# Patient Record
Sex: Male | Born: 1960 | Race: White | Hispanic: No | Smoking: Current every day smoker
Health system: Southern US, Community
[De-identification: ages and names within clinical notes are randomized; demographics above are authoritative.]

## PROBLEM LIST (undated history)

## (undated) DIAGNOSIS — F319 Bipolar disorder, unspecified: Secondary | ICD-10-CM

## (undated) DIAGNOSIS — Z8619 Personal history of other infectious and parasitic diseases: Secondary | ICD-10-CM

## (undated) DIAGNOSIS — I10 Essential (primary) hypertension: Secondary | ICD-10-CM

## (undated) DIAGNOSIS — R569 Unspecified convulsions: Secondary | ICD-10-CM

## (undated) DIAGNOSIS — F1010 Alcohol abuse, uncomplicated: Secondary | ICD-10-CM

## (undated) HISTORY — DX: Unspecified convulsions: R56.9

## (undated) HISTORY — DX: Essential (primary) hypertension: I10

## (undated) HISTORY — PX: REPLACEMENT TOTAL HIP W/  RESURFACING IMPLANTS: SUR1222

## (undated) HISTORY — DX: Personal history of other infectious and parasitic diseases: Z86.19

---

## 2002-10-05 DIAGNOSIS — R569 Unspecified convulsions: Secondary | ICD-10-CM

## 2002-10-05 HISTORY — DX: Unspecified convulsions: R56.9

## 2004-05-16 ENCOUNTER — Other Ambulatory Visit: Payer: Self-pay

## 2008-07-03 LAB — HM COLONOSCOPY

## 2010-10-05 HISTORY — PX: JOINT REPLACEMENT: SHX530

## 2011-04-14 ENCOUNTER — Emergency Department: Payer: Self-pay | Admitting: Emergency Medicine

## 2011-06-25 ENCOUNTER — Inpatient Hospital Stay: Payer: Self-pay | Admitting: Psychiatry

## 2012-06-18 ENCOUNTER — Inpatient Hospital Stay: Payer: Self-pay | Admitting: Internal Medicine

## 2012-06-18 LAB — COMPREHENSIVE METABOLIC PANEL
Albumin: 3.7 g/dL (ref 3.4–5.0)
Anion Gap: 21 — ABNORMAL HIGH (ref 7–16)
BUN: 2 mg/dL — ABNORMAL LOW (ref 7–18)
Calcium, Total: 8 mg/dL — ABNORMAL LOW (ref 8.5–10.1)
Chloride: 78 mmol/L — ABNORMAL LOW (ref 98–107)
Glucose: 52 mg/dL — ABNORMAL LOW (ref 65–99)
Osmolality: 225 (ref 275–301)
Potassium: 4.2 mmol/L (ref 3.5–5.1)
SGOT(AST): 388 U/L — ABNORMAL HIGH (ref 15–37)
Sodium: 114 mmol/L — CL (ref 136–145)
Total Protein: 6.8 g/dL (ref 6.4–8.2)

## 2012-06-18 LAB — CBC
MCV: 101 fL — ABNORMAL HIGH (ref 80–100)
Platelet: 152 10*3/uL (ref 150–440)
RDW: 13.4 % (ref 11.5–14.5)
WBC: 5.6 10*3/uL (ref 3.8–10.6)

## 2012-06-18 LAB — URINALYSIS, COMPLETE
Bilirubin,UR: NEGATIVE
Glucose,UR: 50 mg/dL (ref 0–75)
Nitrite: NEGATIVE
Ph: 6 (ref 4.5–8.0)
Specific Gravity: 1.002 (ref 1.003–1.030)
Squamous Epithelial: NONE SEEN
WBC UR: 1 /HPF (ref 0–5)

## 2012-06-18 LAB — DRUG SCREEN, URINE
Amphetamines, Ur Screen: NEGATIVE (ref ?–1000)
Benzodiazepine, Ur Scrn: NEGATIVE (ref ?–200)
Cannabinoid 50 Ng, Ur ~~LOC~~: NEGATIVE (ref ?–50)
Cocaine Metabolite,Ur ~~LOC~~: NEGATIVE (ref ?–300)
Methadone, Ur Screen: NEGATIVE (ref ?–300)
Opiate, Ur Screen: NEGATIVE (ref ?–300)
Phencyclidine (PCP) Ur S: NEGATIVE (ref ?–25)
Tricyclic, Ur Screen: NEGATIVE (ref ?–1000)

## 2012-06-18 LAB — SODIUM
Sodium: 121 mmol/L — ABNORMAL LOW (ref 136–145)
Sodium: 121 mmol/L — ABNORMAL LOW (ref 136–145)

## 2012-06-19 LAB — CBC WITH DIFFERENTIAL/PLATELET
Basophil %: 0.3 %
Eosinophil %: 1.6 %
HGB: 13 g/dL (ref 13.0–18.0)
Lymphocyte #: 0.5 10*3/uL — ABNORMAL LOW (ref 1.0–3.6)
MCH: 36 pg — ABNORMAL HIGH (ref 26.0–34.0)
MCHC: 36.2 g/dL — ABNORMAL HIGH (ref 32.0–36.0)
MCV: 100 fL (ref 80–100)
Monocyte #: 0.3 x10 3/mm (ref 0.2–1.0)
Neutrophil #: 2.6 10*3/uL (ref 1.4–6.5)
Neutrophil %: 75.7 %
Platelet: 114 10*3/uL — ABNORMAL LOW (ref 150–440)
RBC: 3.62 10*6/uL — ABNORMAL LOW (ref 4.40–5.90)

## 2012-06-19 LAB — COMPREHENSIVE METABOLIC PANEL
Albumin: 3.5 g/dL (ref 3.4–5.0)
Alkaline Phosphatase: 113 U/L (ref 50–136)
Anion Gap: 9 (ref 7–16)
Calcium, Total: 8.6 mg/dL (ref 8.5–10.1)
Chloride: 91 mmol/L — ABNORMAL LOW (ref 98–107)
Co2: 22 mmol/L (ref 21–32)
EGFR (African American): >60
EGFR (Non-African Amer.): >60
Glucose: 91 mg/dL (ref 65–99)
Osmolality: 243 (ref 275–301)
Potassium: 3.7 mmol/L (ref 3.5–5.1)
SGOT(AST): 264 U/L — ABNORMAL HIGH (ref 15–37)
Sodium: 122 mmol/L — ABNORMAL LOW (ref 136–145)

## 2012-06-19 LAB — LIPASE, BLOOD: Lipase: 426 U/L — ABNORMAL HIGH (ref 73–393)

## 2012-06-20 LAB — COMPREHENSIVE METABOLIC PANEL
Albumin: 3.2 g/dL — ABNORMAL LOW (ref 3.4–5.0)
BUN: 2 mg/dL — ABNORMAL LOW (ref 7–18)
Bilirubin,Total: 0.9 mg/dL (ref 0.2–1.0)
Chloride: 97 mmol/L — ABNORMAL LOW (ref 98–107)
Co2: 23 mmol/L (ref 21–32)
Creatinine: 0.81 mg/dL (ref 0.60–1.30)
EGFR (African American): >60
EGFR (Non-African Amer.): >60
Glucose: 117 mg/dL — ABNORMAL HIGH (ref 65–99)
Osmolality: 258 (ref 275–301)
SGOT(AST): 216 U/L — ABNORMAL HIGH (ref 15–37)
SGPT (ALT): 158 U/L — ABNORMAL HIGH (ref 12–78)
Sodium: 130 mmol/L — ABNORMAL LOW (ref 136–145)
Total Protein: 6 g/dL — ABNORMAL LOW (ref 6.4–8.2)

## 2012-06-21 LAB — COMPREHENSIVE METABOLIC PANEL
Albumin: 3.6 g/dL (ref 3.4–5.0)
Anion Gap: 10 (ref 7–16)
BUN: 2 mg/dL — ABNORMAL LOW (ref 7–18)
Co2: 23 mmol/L (ref 21–32)
Osmolality: 256 (ref 275–301)
Potassium: 3.9 mmol/L (ref 3.5–5.1)
Sodium: 130 mmol/L — ABNORMAL LOW (ref 136–145)
Total Protein: 6.6 g/dL (ref 6.4–8.2)

## 2012-06-23 LAB — BASIC METABOLIC PANEL
BUN: 5 mg/dL — ABNORMAL LOW (ref 7–18)
Chloride: 94 mmol/L — ABNORMAL LOW (ref 98–107)
Co2: 29 mmol/L (ref 21–32)
Creatinine: 0.67 mg/dL (ref 0.60–1.30)
EGFR (Non-African Amer.): >60
Osmolality: 261 (ref 275–301)
Potassium: 3.8 mmol/L (ref 3.5–5.1)
Sodium: 132 mmol/L — ABNORMAL LOW (ref 136–145)

## 2012-06-23 LAB — CBC WITH DIFFERENTIAL/PLATELET
Basophil %: 1 %
Eosinophil %: 5.9 %
HCT: 36 % — ABNORMAL LOW (ref 40.0–52.0)
HGB: 12.9 g/dL — ABNORMAL LOW (ref 13.0–18.0)
Lymphocyte #: 1.9 10*3/uL (ref 1.0–3.6)
MCH: 36.5 pg — ABNORMAL HIGH (ref 26.0–34.0)
MCV: 102 fL — ABNORMAL HIGH (ref 80–100)
Monocyte #: 1 x10 3/mm (ref 0.2–1.0)
Neutrophil #: 1.8 10*3/uL (ref 1.4–6.5)
Platelet: 177 10*3/uL (ref 150–440)
RBC: 3.53 10*6/uL — ABNORMAL LOW (ref 4.40–5.90)
WBC: 5.1 10*3/uL (ref 3.8–10.6)

## 2012-06-23 LAB — LIPASE, BLOOD: Lipase: 516 U/L — ABNORMAL HIGH (ref 73–393)

## 2012-06-25 LAB — BASIC METABOLIC PANEL
BUN: 9 mg/dL (ref 7–18)
Chloride: 100 mmol/L (ref 98–107)
Co2: 28 mmol/L (ref 21–32)
Creatinine: 0.77 mg/dL (ref 0.60–1.30)
EGFR (African American): >60
EGFR (Non-African Amer.): >60
Glucose: 90 mg/dL (ref 65–99)
Potassium: 4.2 mmol/L (ref 3.5–5.1)
Sodium: 136 mmol/L (ref 136–145)

## 2013-04-11 ENCOUNTER — Ambulatory Visit: Payer: Self-pay | Admitting: Adult Health

## 2013-04-12 ENCOUNTER — Ambulatory Visit: Payer: Self-pay | Admitting: Adult Health

## 2013-04-19 ENCOUNTER — Ambulatory Visit: Payer: Self-pay | Admitting: Adult Health

## 2013-04-27 ENCOUNTER — Encounter: Payer: Self-pay | Admitting: Adult Health

## 2013-04-27 ENCOUNTER — Ambulatory Visit (INDEPENDENT_AMBULATORY_CARE_PROVIDER_SITE_OTHER): Payer: No Typology Code available for payment source | Admitting: Adult Health

## 2013-04-27 VITALS — BP 106/68 | HR 70 | Temp 98.0°F | Resp 12 | Ht 65.75 in | Wt 131.0 lb

## 2013-04-27 DIAGNOSIS — F17200 Nicotine dependence, unspecified, uncomplicated: Secondary | ICD-10-CM

## 2013-04-27 DIAGNOSIS — Z716 Tobacco abuse counseling: Secondary | ICD-10-CM | POA: Insufficient documentation

## 2013-04-27 DIAGNOSIS — Z23 Encounter for immunization: Secondary | ICD-10-CM

## 2013-04-27 DIAGNOSIS — Z7189 Other specified counseling: Secondary | ICD-10-CM

## 2013-04-27 DIAGNOSIS — I10 Essential (primary) hypertension: Secondary | ICD-10-CM

## 2013-04-27 DIAGNOSIS — Z0000 Encounter for general adult medical examination without abnormal findings: Secondary | ICD-10-CM | POA: Insufficient documentation

## 2013-04-27 LAB — CBC WITH DIFFERENTIAL/PLATELET
Basophils Absolute: 0.1 10*3/uL (ref 0.0–0.1)
Eosinophils Absolute: 0.5 10*3/uL (ref 0.0–0.7)
Hemoglobin: 15.3 g/dL (ref 13.0–17.0)
Lymphocytes Relative: 42.8 % (ref 12.0–46.0)
MCHC: 35.2 g/dL (ref 30.0–36.0)
Monocytes Relative: 10.2 % (ref 3.0–12.0)
Neutro Abs: 1.7 10*3/uL (ref 1.4–7.7)
Neutrophils Relative %: 35.1 % — ABNORMAL LOW (ref 43.0–77.0)
RBC: 4.17 Mil/uL — ABNORMAL LOW (ref 4.22–5.81)
RDW: 12.9 % (ref 11.5–14.6)

## 2013-04-27 LAB — BASIC METABOLIC PANEL
BUN: 6 mg/dL (ref 6–23)
CO2: 25 mEq/L (ref 19–32)
Calcium: 9.1 mg/dL (ref 8.4–10.5)
Chloride: 98 mEq/L (ref 96–112)
Creatinine, Ser: 0.6 mg/dL (ref 0.4–1.5)
Glucose, Bld: 80 mg/dL (ref 70–99)

## 2013-04-27 LAB — HEPATIC FUNCTION PANEL
ALT: 15 U/L (ref 0–53)
Albumin: 3.9 g/dL (ref 3.5–5.2)
Total Protein: 6.4 g/dL (ref 6.0–8.3)

## 2013-04-27 LAB — LIPID PANEL
HDL: 96.5 mg/dL (ref >39.00)
Triglycerides: 75 mg/dL (ref 0.0–149.0)
VLDL: 15 mg/dL (ref 0.0–40.0)

## 2013-04-27 MED ORDER — PNEUMOCOCCAL VAC POLYVALENT 25 MCG/0.5ML IJ INJ: 0.5000 mL | INJECTION | INTRAMUSCULAR | Status: AC

## 2013-04-27 MED ORDER — TETANUS-DIPHTH-ACELL PERTUSSIS 5-2.5-18.5 LF-MCG/0.5 IM SUSP: 0.5000 mL | Freq: Once | INTRAMUSCULAR | Status: DC

## 2013-04-27 NOTE — Assessment & Plan Note (Signed)
Currently smoking one half pack per day. Actively trying to cut back by using e-cigarette. Discussed harmful effects of cigarettes not only involves the lungs but also affects cardiovascular system, bladder and other organs.  Continue to follow and offer support throughout the way.

## 2013-04-27 NOTE — Progress Notes (Signed)
Subjective:    Patient ID: Jacob Velez, male    DOB: September 09, 1961, 52 y.o.   MRN: [id number]  HPI  Patient is a pleasant 52 year old male who presents to clinic to establish care. He has a history of hypertension that is well controlled on atenolol. He also reports childhood history of having a staph infection on his right leg after being bit by chiggers and scratching with dirty nails. He was hospitalized with this infection for 10 days. Patient had right hip joint replacement in 2012 and reports that it was sequela of this infection. He was recently living in [place] and was getting his medical care there. He recently moved to the area to help care for his elderly parents. He has been a smoker for greater than 20 years and would like to quit. He is going to start using the e-cigarette. Patient is feeling well overall.   Past Medical History  Diagnosis Date  . Hypertension   . Seizures 2004    due to dehydration, low sodium   . H/O staphylococcal infection Childhood    right leg. Chigger bite and scratched "with dirty nails"    Past Surgical History  Procedure Laterality Date  . Joint replacement Right 2012    right hip replacement    Family History  Problem Relation Age of Onset  . Seizures Mother   . Stroke Father 62    2/2 chemicals exposed to during [place]. He was a Occupational hygienist    History   Social History  . Marital Status: Single    Spouse Name: N/A    Number of Children: 2  . Years of Education: 16   Occupational History  . [employer]    Social History Main Topics  . Smoking status: Current Every Day Smoker -- 0.50 packs/day for 20 years  . Smokeless tobacco: Never Used  . Alcohol Use: 0.0 oz/week    5-7 Cans of beer per week  . Drug Use: No  . Sexually Active: Not on file   Other Topics Concern  . Not on file   Social History Narrative   Patient was born in [place]. His father was a Hotel manager man. They later moved to [facility] and was there until  age 15. His father was then transferred to [place], [place] and later to [place], [place] to be the area [employer]. 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 He recently moved back to the area after living years in [place]. He is divorced and has a son and daughter. He lives at home by himself. He moved to the area to be closer to his elderly parents. Both of his children are away in college. One is at [employer] and the other at [employer]. Patient enjoys playing golf, going to baseball games, fishing, etc. He enjoys the outdoors.     Health Maintenance:  Tdap - 04/27/13  Colonoscopy - 2009 Finding of internal hemorrhoids  Labs - 04/27/13 - cbc w/diff, bmet, hepatic panel, lipids, tsh  Depression Screen - no sadness, hopelessness or feelings of anhedonia. No hx of depression or related.  Tobacco Use - Current every day. Ready to quit. Will try e-cigarettes   Dental Exams - 1 year ago  Vision Exam - Never had done    Review of Systems  Constitutional: Negative.   HENT: Negative.   Eyes: Negative for visual disturbance.       Wears reading glasses.  Respiratory: Negative.   Cardiovascular: Negative.  Gastrointestinal: Negative.   Endocrine: Negative.   Genitourinary: Negative.   Musculoskeletal: Positive for back pain.       Low back discomfort. He attributes this to having flat feet.  Skin:       Mild eczema on his back.   Allergic/Immunologic:       Year round allergies. Has not been taking any antihistamines.  Neurological: Negative for dizziness, tremors, seizures, syncope, speech difficulty, weakness, light-headedness and headaches.  Hematological: Negative.   Psychiatric/Behavioral: Negative for suicidal ideas, hallucinations, behavioral problems, confusion, sleep disturbance, decreased concentration and agitation. The patient is not nervous/anxious and is not hyperactive.     BP 106/68  Pulse 70  Temp(Src) 98 F (36.7 C) (Oral)   Resp 12  Ht 5' 5.75" (1.67 m)  Wt 131 lb (59.421 kg)  BMI 21.31 kg/m2  SpO2 97%    Objective:   Physical Exam  Constitutional: He is oriented to person, place, and time. He appears well-developed and well-nourished. No distress.  HENT:  Head: Normocephalic and atraumatic.  Right Ear: External ear normal.  Left Ear: External ear normal.  Nose: Nose normal.  Mouth/Throat: Oropharynx is clear and moist.  Neck: Normal range of motion. Neck supple. No tracheal deviation present.  Cardiovascular: Normal rate, regular rhythm, normal heart sounds and intact distal pulses.  Exam reveals no gallop and no friction rub.   No murmur heard. Pulmonary/Chest: Effort normal and breath sounds normal. No respiratory distress. He has no wheezes. He has no rales.  Abdominal: Soft. Bowel sounds are normal. He exhibits no distension and no mass. There is no tenderness. There is no rebound and no guarding.  Musculoskeletal: Normal range of motion. He exhibits no edema and no tenderness.  Lymphadenopathy:    He has no cervical adenopathy.  Neurological: He is alert and oriented to person, place, and time. He has normal reflexes. No cranial nerve deficit. Coordination normal.  Skin: Skin is warm and dry. No rash noted. No erythema. No pallor.  Psychiatric: He has a normal mood and affect. His behavior is normal. Judgment and thought content normal.      Assessment & Plan:

## 2013-04-27 NOTE — Patient Instructions (Addendum)
   Thank you for choosing [place] at [facility] for your health care needs.  Please have your labs drawn prior to leaving the office.  The results will be available through MyChart for your convenience. Please remember to activate this. The activation code is located at the end of this form.  You received your tetanus (Tdap) and Pneumonia vaccines today. It is normal to have some tenderness and slight redness at the site of injections. You have been provided with additional information regarding these vaccines.  Please feel free to call with any questions or concerns.

## 2013-04-27 NOTE — Assessment & Plan Note (Signed)
Discussed importance of smoking cessation. The patient is ready to quit. He reports that his daughter "stays on him" about needing to quit. He is going to try to cut back by using e-cigarette. He is aware that there are other options available should this not prove to be successful for him. Continue to follow.

## 2013-04-27 NOTE — Assessment & Plan Note (Signed)
Normal physical exam. Tetanus and pneumonia vaccine given. Check the following labs: CBC with differential, TSH, basic metabolic panel, hepatic panel, lipids. Colonoscopy is up to date. He had this in 2009 and reports findings of internal hemorrhoids. Will try to obtain medical records from previous PCP.

## 2013-04-27 NOTE — Addendum Note (Signed)
Addended by: Chandra Batch E on: 04/27/2013 09:55 AM   Modules accepted: Orders

## 2013-04-28 ENCOUNTER — Other Ambulatory Visit: Payer: Self-pay | Admitting: Adult Health

## 2013-04-28 DIAGNOSIS — D649 Anemia, unspecified: Secondary | ICD-10-CM

## 2013-05-15 ENCOUNTER — Telehealth: Payer: Self-pay | Admitting: Adult Health

## 2013-05-15 MED ORDER — ATENOLOL 100 MG PO TABS: 100.0000 mg | ORAL_TABLET | Freq: Every day | ORAL | Status: DC

## 2013-05-15 NOTE — Telephone Encounter (Signed)
The patient only has one left.  atenolol (TENORMIN) 100 MG tablet

## 2013-11-08 ENCOUNTER — Telehealth: Payer: Self-pay | Admitting: Adult Health

## 2013-11-08 MED ORDER — ATENOLOL 100 MG PO TABS: 100.0000 mg | ORAL_TABLET | Freq: Every day | ORAL | Status: DC

## 2013-11-08 NOTE — Telephone Encounter (Signed)
Refill request not received. Rx sent to pharmacy by escript

## 2013-11-08 NOTE — Telephone Encounter (Signed)
The patient is down to one tablet . [facility] stated they had faxed the refill request two days ago.   atenolol (TENORMIN) 100 MG tablet

## 2014-04-28 ENCOUNTER — Other Ambulatory Visit: Payer: Self-pay | Admitting: Adult Health

## 2014-05-29 ENCOUNTER — Other Ambulatory Visit: Payer: Self-pay | Admitting: Adult Health

## 2014-07-03 ENCOUNTER — Ambulatory Visit (INDEPENDENT_AMBULATORY_CARE_PROVIDER_SITE_OTHER): Payer: No Typology Code available for payment source | Admitting: Internal Medicine

## 2014-07-03 ENCOUNTER — Encounter: Payer: Self-pay | Admitting: Internal Medicine

## 2014-07-03 VITALS — BP 148/86 | HR 67 | Temp 98.3°F | Resp 16 | Ht 66.0 in | Wt 133.5 lb

## 2014-07-03 DIAGNOSIS — Z13220 Encounter for screening for lipoid disorders: Secondary | ICD-10-CM

## 2014-07-03 DIAGNOSIS — Z125 Encounter for screening for malignant neoplasm of prostate: Secondary | ICD-10-CM

## 2014-07-03 DIAGNOSIS — Z716 Tobacco abuse counseling: Secondary | ICD-10-CM

## 2014-07-03 DIAGNOSIS — Z0000 Encounter for general adult medical examination without abnormal findings: Secondary | ICD-10-CM

## 2014-07-03 DIAGNOSIS — R5381 Other malaise: Secondary | ICD-10-CM

## 2014-07-03 DIAGNOSIS — D7589 Other specified diseases of blood and blood-forming organs: Secondary | ICD-10-CM

## 2014-07-03 DIAGNOSIS — F17200 Nicotine dependence, unspecified, uncomplicated: Secondary | ICD-10-CM

## 2014-07-03 DIAGNOSIS — R5383 Other fatigue: Secondary | ICD-10-CM

## 2014-07-03 DIAGNOSIS — E871 Hypo-osmolality and hyponatremia: Secondary | ICD-10-CM

## 2014-07-03 DIAGNOSIS — I10 Essential (primary) hypertension: Secondary | ICD-10-CM

## 2014-07-03 DIAGNOSIS — Z1159 Encounter for screening for other viral diseases: Secondary | ICD-10-CM

## 2014-07-03 DIAGNOSIS — Z7189 Other specified counseling: Secondary | ICD-10-CM

## 2014-07-03 LAB — CBC WITH DIFFERENTIAL/PLATELET
Basophils Absolute: 0 10*3/uL (ref 0.0–0.1)
Basophils Relative: 0.6 % (ref 0.0–3.0)
EOS ABS: 0.2 10*3/uL (ref 0.0–0.7)
Eosinophils Relative: 2.8 % (ref 0.0–5.0)
HCT: 41.2 % (ref 39.0–52.0)
Hemoglobin: 14.4 g/dL (ref 13.0–17.0)
LYMPHS PCT: 21.5 % (ref 12.0–46.0)
Lymphs Abs: 1.4 10*3/uL (ref 0.7–4.0)
MCHC: 34.9 g/dL (ref 30.0–36.0)
MCV: 104.3 fl — ABNORMAL HIGH (ref 78.0–100.0)
Monocytes Absolute: 0.7 10*3/uL (ref 0.1–1.0)
Monocytes Relative: 10.2 % (ref 3.0–12.0)
NEUTROS PCT: 64.9 % (ref 43.0–77.0)
Neutro Abs: 4.3 10*3/uL (ref 1.4–7.7)
Platelets: 265 10*3/uL (ref 150.0–400.0)
RBC: 3.95 Mil/uL — ABNORMAL LOW (ref 4.22–5.81)
RDW: 13.4 % (ref 11.5–15.5)
WBC: 6.7 10*3/uL (ref 4.0–10.5)

## 2014-07-03 LAB — LIPID PANEL
CHOLESTEROL: 144 mg/dL (ref 0–200)
HDL: 92.5 mg/dL (ref >39.00)
LDL CALC: 43 mg/dL (ref 0–99)
NonHDL: 51.5
TRIGLYCERIDES: 42 mg/dL (ref 0.0–149.0)
Total CHOL/HDL Ratio: 2
VLDL: 8.4 mg/dL (ref 0.0–40.0)

## 2014-07-03 LAB — COMPREHENSIVE METABOLIC PANEL
ALT: 13 U/L (ref 0–53)
AST: 18 U/L (ref 0–37)
Albumin: 3.8 g/dL (ref 3.5–5.2)
Alkaline Phosphatase: 38 U/L — ABNORMAL LOW (ref 39–117)
BILIRUBIN TOTAL: 0.7 mg/dL (ref 0.2–1.2)
BUN: 7 mg/dL (ref 6–23)
CO2: 27 meq/L (ref 19–32)
CREATININE: 0.7 mg/dL (ref 0.4–1.5)
Calcium: 9 mg/dL (ref 8.4–10.5)
Chloride: 97 mEq/L (ref 96–112)
GFR: 120.19 mL/min (ref >60.00)
Glucose, Bld: 92 mg/dL (ref 70–99)
Potassium: 5.1 mEq/L (ref 3.5–5.1)
Sodium: 127 mEq/L — ABNORMAL LOW (ref 135–145)
Total Protein: 6.4 g/dL (ref 6.0–8.3)

## 2014-07-03 LAB — PSA: PSA: 0.34 ng/mL (ref 0.10–4.00)

## 2014-07-03 LAB — TSH: TSH: 1.17 u[IU]/mL (ref 0.35–4.50)

## 2014-07-03 MED ORDER — ATENOLOL 100 MG PO TABS: 100.0000 mg | ORAL_TABLET | Freq: Two times a day (BID) | ORAL | Status: DC

## 2014-07-03 MED ORDER — FEXOFENADINE HCL 180 MG PO TABS: 180.0000 mg | ORAL_TABLET | Freq: Every day | ORAL | Status: DC

## 2014-07-03 MED ORDER — VARENICLINE TARTRATE 0.5 MG X 11 & 1 MG X 42 PO MISC: ORAL | Status: DC

## 2014-07-03 MED ORDER — VARENICLINE TARTRATE 1 MG PO TABS: 1.0000 mg | ORAL_TABLET | Freq: Two times a day (BID) | ORAL | Status: DC

## 2014-07-03 NOTE — Progress Notes (Addendum)
Patient ID: Jacob LusterSteven Acero, male   DOB: [birth year], 53 y.o.   MRN: [id number]  The patient is here for his annual male physical examination and follow up on other chronic problems, including hypertension and tobacco abuse.    The risk factors are reflected in the social history.  The roster of all physicians providing medical care to patient - is listed in the Snapshot section of the chart.  Activities of daily living:  The patient is 100% independent in all ADLs: dressing, toileting, feeding as well as independent mobility  Home safety : The patient has smoke detectors in the home. He wears seatbelts.  There are no firearms at home. There is no violence in the home.   There is no risks for hepatitis, STDs or HIV. There is no   history of blood transfusion. There is no travel history to infectious disease endemic areas of the world.  The patient has seen their dentist in the last six month and  their eye doctor in the last year.  They do not  have excessive sun exposure. They have seen a dermatoloigist in the last year. Discussed the need for sun protection: hats, long sleeves and use of sunscreen if there is significant sun exposure.   Diet: the importance of a healthy diet is discussed. They do have a healthy diet.  The benefits of regular aerobic exercise were discussed. He does not exercise .  Depression screen: there are no signs or vegative symptoms of depression- irritability, change in appetite, anhedonia, sadness/tearfullness.  The following portions of the patient's history were reviewed and updated as appropriate: allergies, current medications, past family history, past medical history,  past surgical history, past social history  and problem list.  Visual acuity was not assessed per patient preference  Hearing and body mass index were assessed and reviewed.   During the course of the visit the patient was educated and counseled about appropriate screening and preventive services  including :  nutrition counseling, colorectal cancer screening, and recommended immunizations.    Objective;  BP 148/86  Pulse 67  Temp(Src) 98.3 F (36.8 C) (Oral)  Resp 16  Ht 5\' 6"  (1.676 m)  Wt 133 lb 8 oz (60.555 kg)  BMI 21.56 kg/m2  SpO2 98%  General Appearance:    Alert, cooperative, no distress, appears stated age  Head:    Normocephalic, without obvious abnormality, atraumatic  Eyes:    PERRL, conjunctiva/corneas clear, EOM's intact, fundi    benign, both eyes       Ears:    Normal TM's and external ear canals, both ears  Nose:   Nares normal, septum midline, mucosa normal, no drainage   or sinus tenderness  Throat:   Lips, mucosa, and tongue normal; teeth and gums normal  Neck:   Supple, symmetrical, trachea midline, no adenopathy;       thyroid:  No enlargement/tenderness/nodules; no carotid   bruit or JVD  Back:     Symmetric, no curvature, ROM normal, no CVA tenderness  Lungs:     Clear to auscultation bilaterally, respirations unlabored  Chest wall:    No tenderness or deformity  Heart:    Regular rate and rhythm, S1 and S2 normal, no murmur, rub   or gallop  Abdomen:     Soft, non-tender, bowel sounds active all four quadrants,    no masses, no organomegaly  Genitalia:    Deferred per patient request    Rectal:    Deferred per patient request  Extremities:   Extremities normal, atraumatic, no cyanosis or edema  Pulses:   2+ and symmetric all extremities  Skin:   Skin color, texture, turgor normal, no rashes or lesions  Lymph nodes:   Cervical, supraclavicular, and axillary nodes normal  Neurologic:   CNII-XII intact. Normal strength, sensation and reflexes      throughout   Assessment and Plan:  Tobacco abuse counseling He has smoked since his post college years,  He quit once for a year using the patch. The patient was counseled on the dangers of tobacco use, and was advised to quit.  Reviewed strategies to maximize success, including substitution of  other forms of reinforcement. He is motivated to quit,  Chantix therapy discussed and prescribed.   Essential hypertension, benign Increased atenolol to bid for better control.  .renal function and lytes normal today.  Lab Results  Component Value Date   NA 127* 07/03/2014   K 5.1 07/03/2014   CL 97 07/03/2014   CO2 27 07/03/2014   Lab Results  Component Value Date   CREATININE 0.7 07/03/2014     Hyponatremia Chronic, asymptomatic,  But lower than last year .  DDX includes beer potomania and SIADh from neoplasm. Patient advised to return for chest CT given long history of tobacco abuse  Routine general medical examination at a health care facility Annual comprehensive exam was done . All screenings have been addressed and a printed health maintenance schedule was given to patient.    Macrocytosis without anemia Patient advised to return for B12 and folic acid levels.     Updated Medication List Outpatient Encounter Prescriptions as of 07/03/2014  Medication Sig  . atenolol (TENORMIN) 100 MG tablet Take 1 tablet (100 mg total) by mouth 2 (two) times daily.  . [DISCONTINUED] atenolol (TENORMIN) 100 MG tablet TAKE 1 TABLET (100 MG TOTAL) BY MOUTH DAILY.  . fexofenadine (ALLEGRA) 180 MG tablet Take 1 tablet (180 mg total) by mouth daily.  . varenicline (CHANTIX CONTINUING MONTH PAK) 1 MG tablet Take 1 tablet (1 mg total) by mouth 2 (two) times daily.  . varenicline (CHANTIX STARTING MONTH PAK) 0.5 MG X 11 & 1 MG X 42 tablet Take one 0.5 mg tablet by mouth once daily for 3 days, then increase to one 0.5 mg tablet twice daily for 4 days, then increase to one 1 mg tablet twice daily.

## 2014-07-03 NOTE — Progress Notes (Signed)
Pre-visit discussion using our clinic review tool. No additional management support is needed unless otherwise documented below in the visit note.  

## 2014-07-03 NOTE — Assessment & Plan Note (Addendum)
Increased atenolol to bid for better control.  .renal function and lytes normal today.  Lab Results  Component Value Date   NA 127* 07/03/2014   K 5.1 07/03/2014   CL 97 07/03/2014   CO2 27 07/03/2014   Lab Results  Component Value Date   CREATININE 0.7 07/03/2014

## 2014-07-03 NOTE — Assessment & Plan Note (Signed)
Annual comprehensive exam was done . All screenings have been addressed and a printed health maintenance schedule was given to patient.   

## 2014-07-03 NOTE — Patient Instructions (Signed)
I have  Refilled your atenolol for TWICE DAILY use for hypertension  I have given you 3 months of Chanitx to help you quit smoking  You are not due for colon cancer screening until 2019  Health Maintenance A healthy lifestyle and preventative care can promote health and wellness.  Maintain regular health, dental, and eye exams.  Eat a healthy diet. Foods like vegetables, fruits, whole grains, low-fat dairy products, and lean protein foods contain the nutrients you need and are low in calories. Decrease your intake of foods high in solid fats, added sugars, and salt. Get information about a proper diet from your health care provider, if necessary.  Regular physical exercise is one of the most important things you can do for your health. Most adults should get at least 150 minutes of moderate-intensity exercise (any activity that increases your heart rate and causes you to sweat) each week. In addition, most adults need muscle-strengthening exercises on 2 or more days a week.   Maintain a healthy weight. The body mass index (BMI) is a screening tool to identify possible weight problems. It provides an estimate of body fat based on height and weight. Your health care provider can find your BMI and can help you achieve or maintain a healthy weight. For males 20 years and older:  A BMI below 18.5 is considered underweight.  A BMI of 18.5 to 24.9 is normal.  A BMI of 25 to 29.9 is considered overweight.  A BMI of 30 and above is considered obese.  Maintain normal blood lipids and cholesterol by exercising and minimizing your intake of saturated fat. Eat a balanced diet with plenty of fruits and vegetables. Blood tests for lipids and cholesterol should begin at age 51 and be repeated every 5 years. If your lipid or cholesterol levels are high, you are over age 51, or you are at high risk for heart disease, you may need your cholesterol levels checked more frequently.Ongoing high lipid and  cholesterol levels should be treated with medicines if diet and exercise are not working.  If you smoke, find out from your health care provider how to quit. If you do not use tobacco, do not start.  Lung cancer screening is recommended for adults aged 55-80 years who are at high risk for developing lung cancer because of a history of smoking. A yearly low-dose CT scan of the lungs is recommended for people who have at least a 30-pack-year history of smoking and are current smokers or have quit within the past 15 years. A pack year of smoking is smoking an average of 1 pack of cigarettes a day for 1 year (for example, a 30-pack-year history of smoking could mean smoking 1 pack a day for 30 years or 2 packs a day for 15 years). Yearly screening should continue until the smoker has stopped smoking for at least 15 years. Yearly screening should be stopped for people who develop a health problem that would prevent them from having lung cancer treatment.  If you choose to drink alcohol, do not have more than 2 drinks per day. One drink is considered to be 12 oz (360 mL) of beer, 5 oz (150 mL) of wine, or 1.5 oz (45 mL) of liquor.  Avoid the use of street drugs. Do not share needles with anyone. Ask for help if you need support or instructions about stopping the use of drugs.  High blood pressure causes heart disease and increases the risk of stroke. Blood pressure  should be checked at least every 1-2 years. Ongoing high blood pressure should be treated with medicines if weight loss and exercise are not effective.  If you are 45-79 years old, ask your health care provider if you should take aspirin to prevent heart disease.  Diabetes screening involves taking a blood sample to check your fasting blood sugar level. This should be done once every 3 years after age 45 if you are at a normal weight and without risk factors for diabetes. Testing should be considered at a younger age or be carried out more  frequently if you are overweight and have at least 1 risk factor for diabetes.  Colorectal cancer can be detected and often prevented. Most routine colorectal cancer screening begins at the age of 50 and continues through age 75. However, your health care provider may recommend screening at an earlier age if you have risk factors for colon cancer. On a yearly basis, your health care provider may provide home test kits to check for hidden blood in the stool. A small camera at the end of a tube may be used to directly examine the colon (sigmoidoscopy or colonoscopy) to detect the earliest forms of colorectal cancer. Talk to your health care provider about this at age 50 when routine screening begins. A direct exam of the colon should be repeated every 5-10 years through age 75, unless early forms of precancerous polyps or small growths are found.  People who are at an increased risk for hepatitis B should be screened for this virus. You are considered at high risk for hepatitis B if:  You were born in a country where hepatitis B occurs often. Talk with your health care provider about which countries are considered high risk.  Your parents were born in a high-risk country and you have not received a shot to protect against hepatitis B (hepatitis B vaccine).  You have HIV or AIDS.  You use needles to inject street drugs.  You live with, or have sex with, someone who has hepatitis B.  You are a man who has sex with other men (MSM).  You get hemodialysis treatment.  You take certain medicines for conditions like cancer, organ transplantation, and autoimmune conditions.  Hepatitis C blood testing is recommended for all people born from 1945 through 1965 and any individual with known risk factors for hepatitis C.  Healthy men should no longer receive prostate-specific antigen (PSA) blood tests as part of routine cancer screening. Talk to your health care provider about prostate cancer  screening.  Testicular cancer screening is not recommended for adolescents or adult males who have no symptoms. Screening includes self-exam, a health care provider exam, and other screening tests. Consult with your health care provider about any symptoms you have or any concerns you have about testicular cancer.  Practice safe sex. Use condoms and avoid high-risk sexual practices to reduce the spread of sexually transmitted infections (STIs).  You should be screened for STIs, including gonorrhea and chlamydia if:  You are sexually active and are younger than 24 years.  You are older than 24 years, and your health care provider tells you that you are at risk for this type of infection.  Your sexual activity has changed since you were last screened, and you are at an increased risk for chlamydia or gonorrhea. Ask your health care provider if you are at risk.  If you are at risk of being infected with HIV, it is recommended that you take a   prescription medicine daily to prevent HIV infection. This is called pre-exposure prophylaxis (PrEP). You are considered at risk if:  You are a man who has sex with other men (MSM).  You are a heterosexual man who is sexually active with multiple partners.  You take drugs by injection.  You are sexually active with a partner who has HIV.  Talk with your health care provider about whether you are at high risk of being infected with HIV. If you choose to begin PrEP, you should first be tested for HIV. You should then be tested every 3 months for as long as you are taking PrEP.  Use sunscreen. Apply sunscreen liberally and repeatedly throughout the day. You should seek shade when your shadow is shorter than you. Protect yourself by wearing long sleeves, pants, a wide-brimmed hat, and sunglasses year round whenever you are outdoors.  Tell your health care provider of new moles or changes in moles, especially if there is a change in shape or color. Also, tell  your health care provider if a mole is larger than the size of a pencil eraser.  A one-time screening for abdominal aortic aneurysm (AAA) and surgical repair of large AAAs by ultrasound is recommended for men aged 65-75 years who are current or former smokers.  Stay current with your vaccines (immunizations). Document Released: 03/19/2008 Document Revised: 09/26/2013 Document Reviewed: 02/16/2011 Exit[employer] Patient Information 2015 [employer]. This information is not intended to replace advice given to you by your health care provider. Make sure you discuss any questions you have with your health care provider.  

## 2014-07-03 NOTE — Assessment & Plan Note (Addendum)
He has smoked since his post college years,  He quit once for a year using the patch. The patient was counseled on the dangers of tobacco use, and was advised to quit.  Reviewed strategies to maximize success, including substitution of other forms of reinforcement. He is motivated to quit,  Chantix therapy discussed and prescribed.

## 2014-07-03 NOTE — Assessment & Plan Note (Addendum)
Chronic, asymptomatic,  But lower than last year .  DDX includes beer potomania and SIADh from neoplasm. Patient advised to return for chest CT given long history of tobacco abuse

## 2014-07-04 ENCOUNTER — Encounter: Payer: Self-pay | Admitting: *Deleted

## 2014-07-04 ENCOUNTER — Telehealth: Payer: Self-pay | Admitting: Adult Health

## 2014-07-04 DIAGNOSIS — D7589 Other specified diseases of blood and blood-forming organs: Secondary | ICD-10-CM | POA: Insufficient documentation

## 2014-07-04 LAB — HEPATITIS C ANTIBODY: HCV Ab: NEGATIVE

## 2014-07-04 NOTE — Addendum Note (Signed)
Addended by: Sherlene ShamsULLO, TERESA L on: 07/04/2014 06:57 AM   Modules accepted: Orders

## 2014-07-04 NOTE — Telephone Encounter (Signed)
emmi emailed °

## 2014-07-04 NOTE — Assessment & Plan Note (Signed)
Patient advised to return for B12 and folic acid levels.

## 2014-12-29 ENCOUNTER — Other Ambulatory Visit: Payer: Self-pay | Admitting: Internal Medicine

## 2015-01-22 NOTE — Consult Note (Signed)
PATIENT NAME:  Jacob Velez, TROMP MR#:  [id number] DATE OF BIRTH:  02/04/1961  DATE OF CONSULTATION:  06/24/2012  REFERRING PHYSICIAN:   CONSULTING PHYSICIAN:  Audery Amel, MD  IDENTIFYING INFORMATION AND REASON FOR CONSULT: This is a 54 year old man in the hospital for alcohol withdrawal as well as hyponatremia and nausea. Consult for further evaluation of substance abuse treatment.  HISTORY OF PRESENT ILLNESS: Information obtained from the patient and from the chart. Also this patient is known to me from previous inpatient hospitalizations. The patient says that prior to this hospitalization he was drinking pretty heavily, probably from a half case to a case of beer a day. He says he knows that it was terrible for him but that he had been out of work and feeling depressed. In the hospital, he has been treated with the alcohol withdrawal protocol and his vital signs have become more stable but he continues to have tremors and to feel sick. Additionally, he has developed pancreatitis which seemed to be improving, although today his lipase was back up. The patient himself requested to be reevaluated for further treatment for alcohol abuse. He tells me that he feels like he needs further assistance with his substance abuse. He feels very uncertain and unstable about being able to stay sober if he were to go home. His mood has been depressed. He is feeling tired. Sleep has been very poor. Concentration has been poor. He denies that he is having any suicidal ideation. He denies any psychotic symptoms. He has not been taking any psychiatric medicine recently or seeing anyone for outpatient treatment. He is a little bit vague about it, but claims that he does have some participation with Alcoholics Anonymous groups, although I am not sure that he is going very frequently.   PAST PSYCHIATRIC HISTORY: I know this patient best from a psychiatric hospitalization in 2012. At that time, he presented with full  grandiose psychotic mania and had an extended stay on the behavioral health unit. He also had a known history of alcohol dependence at that time. He eventually did recover from his mania with lithium and Depakote. It sounds like since that hospitalization he stopped his medicine and went  without any medication for the last year. From the way he describes it, I suspect that he has been hypomanic to manic for significant parts of that time. He does have a history of serious alcohol dependence in the past. Possible seizures, definite blackouts. He has been to the alcohol and drug abuse treatment center in [place] and I believe that he has had extended periods of sobriety. His insight into his bipolar disorder remains very poor right now.   SOCIAL HISTORY: He claims that he has an apartment that he has paid rent on. Apparently he has been out of work for a few months. 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 His closest relative is his mother who has been able to visit him since he has been in the hospital. He is a little bit evasive about how often he has been in contact with her prior to his hospitalization. The patient is not married.   PAST MEDICAL HISTORY: He is in the hospital right now for hyponatremia probably related to his alcohol abuse and pancreatitis. He developed pancreatitis further while he was in the hospital but seems to be recovering from it. History of high blood pressure and gastric reflux as well.   REVIEW OF SYSTEMS: He complains  of feeling very shaky and having a tremor.  Mood feels anxious and nervous. Mood also feels depressed. Denies suicidal ideation. Feels hopeless about his ability to take care of himself right now outside of the hospital. Denies any hallucinations.   MENTAL STATUS EXAM: Neatly dressed and groomed man still hooked up to intravenous fluid, interviewed in his hospital room. Eye contact was good. Psychomotor activity was diminished and he did  have a fine tremor all over his body while we were talking. Speech is easy to understand, a little bit decreased in total amount. Affect is flat, anxious, probably frightened looking. Mood is stated as depressed. Thoughts seem somewhat impoverished and vague at times. No obvious psychosis. Denies hallucinations. Denies suicidal or homicidal ideation. Specific cognitive testing was not done. His baseline intelligence is at least average. Current insight into his full mental illness is not good but his judgment is reasonable at least in feeling like he needs further psychiatric treatment.   ASSESSMENT: This is a 54 year old man with a history of alcohol dependence characterized by long periods of binge drinking who is currently in the hospital for complicated withdrawal with pancreatitis and possibly delirium tremens. He also has a history of type I bipolar disorder which he has not been treating probably for the last year. Right now he is shaky, a little bit slowed and although he presents fairly well I have a feeling that there may be more confusion there than meets the eye. He might even be at the beginning of having more delirium tremens particularly since he is this shaky this many days out from his last drink. The fact that he is requesting very clearly that he not be discharged but be allowed to go to the behavioral health unit to me is a strong indicator that he really does need treatment. When we last had him on the behavioral health unit he very much did not want to be there. This is a real change for him to be requesting treatment. He still does not have much insight into his bipolar disorder but is probably open to having that treated as well.   TREATMENT PLAN: I do think that he probably needs longer term monitoring for his full alcohol withdrawal in the hospital and then some assistance in getting more intensive treatment if he is to achieve any sobriety. Also I think achieving sobriety is probably  necessary to his basic safety given the medical complications he is having. My one concern right now is whether he is medically stable enough to come to behavioral health. He is still on IV fluids and his lipase is going up and his pain is getting worse. Once all of those things are stable enough that he is able to eat well and no longer needs any intravenous fluids we can consider possible transfer to behavioral health for further treatment, referral to further substance abuse counseling, and treatment of his bipolar disorder. I would not add any psychiatric medications right now other than an extra dose of Ativan that I ordered to supplement the CIWA protocol.   DIAGNOSIS PRINCIPLE AND PRIMARY:   AXIS I: Alcohol dependence.   SECONDARY DIAGNOSES:   AXIS I:  1. Rule out delirium tremens.  2. Bipolar disorder type I, depressed currently.   AXIS II: Deferred.   AXIS III: Hyponatremia, pancreatitis, alcohol withdrawal, and hypertension.   AXIS IV: Severe from lack of financial and social support and lack of insight.   AXIS V: Functioning at time of evaluation  35.  ____________________________ Audery Amel, MD jtc:slb D: 06/24/2012 23:09:30 ET     T: 06/25/2012 11:23:10 ET        JOB#: [id number] cc: Audery Amel, MD, <Dictator> Audery Amel MD ELECTRONICALLY SIGNED 06/25/2012 12:04

## 2015-01-22 NOTE — Consult Note (Signed)
Pt seen and examined. See Jacob Velez's notes. Acute pancreatitis with rising lipase although no abd pain right now. Dilated CBD on U/S. MRCP results pending. Dilated CBD prob related to chronic pancreaitis though will see if MRCP shows stone/stricture or not. Keep npo for now. If lipase drops, then can start clear liquid diet. If MRCP clearly abnormal, then ERCP on Wed. [facility]. Thanks.  Electronic Signatures: Lutricia Feilh, Margaree Sandhu (MD)  (Signed on 16-Sep-13 15:57)  Authored  Last Updated: 16-Sep-13 15:57 by Lutricia Feilh, Marcelle Bebout (MD)

## 2015-01-22 NOTE — H&P (Signed)
PATIENT NAME:  Jacob Velez, Jacob Velez MR#:  [id number] DATE OF BIRTH:  Apr 16, 1961  DATE OF ADMISSION:  06/18/2012  PRIMARY CARE PHYSICIAN: Nonlocally located.   CHIEF COMPLAINT: Nausea, vomiting and weakness and abdominal pain.   HISTORY OF PRESENT ILLNESS: This is a 54 year old male who presents to the Emergency Room due to weakness, nausea, vomiting and abdominal pain ongoing for the past few days. Patient says that 2 to 3 days ago he started having recurrent nausea and vomiting. He thought it was related to a viral GI illness and thought it would improve but his symptoms were not improving. Despite having this nausea, vomiting patient continued to have increasing alcohol intake as he has a history of alcohol abuse. Patient got progressively weak and also started developing some diarrhea which was nonbloody and watery in nature. He, therefore, came to the ER for further evaluation. In the Emergency Room, patient was noted to be significantly weak but noted to have a sodium of 114 with abnormal liver function tests and a slightly elevated lipase consistent with pancreatitis. Hospitalist services were contacted for further treatment and evaluation.   Patient does admit to a abdominal pain, which is epigastric in nature, radiating to the back, which is about 5/10 in intensity associated with nausea and vomiting but no fevers, no chills, no sick contacts. Also admits to diarrhea which is nonbloody and loose in nature. Denies any chest pain, any shortness of breath or any other associated symptoms. Hospitalist services were contacted for further treatment and management.   REVIEW OF SYSTEMS: CONSTITUTIONAL: No documented fever. No weight gain. No weight loss. EYES: No blurred or double vision. ENT: No tinnitus. No postnasal drip. No redness of the oropharynx. RESPIRATORY: No cough, no wheeze, no hemoptysis, no dyspnea. CARDIOVASCULAR: No chest pain, no orthopnea, no palpitations, no syncope. GASTROINTESTINAL:  Positive nausea. Positive vomiting. Positive diarrhea. No melena, hematochezia. GENITOURINARY: No dysuria, no hematuria. ENDOCRINE: No polyuria or nocturia. No heat or cold intolerance. HEME: No anemia. No bruising. No bleeding. INTEGUMENTARY: No rashes. No lesions. MUSCULOSKELETAL: No arthritis, no swelling, no gout. NEUROLOGIC: No numbness, no tingling, no ataxia, no seizure-type activity. PSYCH: No anxiety, no insomnia, no ADD.   PAST MEDICAL HISTORY:  1. History of alcohol abuse. 2. Bipolar disorder with mania.  3. Hypertension.  4. Gastroesophageal reflux disease.   ALLERGIES: No known drug allergies.   SOCIAL HISTORY: Does drink about 10 beers to 12 beers per day. Does smoke about a pack per day. No other illicit drug abuse as per patient.   FAMILY HISTORY: Mother has history of seizures. Father had a stroke.   CURRENT MEDICATIONS:  1. Atenolol 100 mg daily.  2. Omeprazole 20 mg daily.  3. Potassium 20 mEq daily. 4. Visine as needed.  5. Vitamin B12 1000 mcg daily.   PHYSICAL EXAMINATION ON ADMISSION:  VITAL SIGNS: Patient's vital signs are noted to be: Temperature 98, pulse 124, respirations 18, blood pressure 147/98, sats 97% on room air.   GENERAL: He is an anxious-appearing male with underlying tremor but in no apparent distress.   HEENT: Atraumatic, normocephalic. Extraocular muscles are intact. Pupils equal, reactive to light. Sclerae anicteric. No conjunctival injection. No pharyngeal erythema.   NECK: Supple. There is no jugular venous distention, no bruits, no lymphadenopathy or thyromegaly.   HEART: Regular rate and rhythm. Tachycardic. No murmurs, no rubs, no clicks.   LUNGS: Clear to auscultation bilaterally. No rales, no rhonchi, no wheezes.   ABDOMEN: Soft, flat, nontender, nondistended. Has good bowel  sounds. No hepatosplenomegaly appreciated.   EXTREMITIES: No evidence of any cyanosis, clubbing, or peripheral edema. Has +2 pedal and radial pulses bilaterally.    NEUROLOGICAL: Patient is alert, awake, oriented x3. No focal motor or sensory deficits appreciated bilaterally.   SKIN: Moist, warm with no rash appreciated.   LYMPHATIC: There is no cervical or axillary lymphadenopathy.   LABORATORY, DIAGNOSTIC, AND RADIOLOGICAL DATA: Serum glucose 52, BUN 2, creatinine 0.6, sodium 114, potassium 4.2, chloride 78, bicarbonate 15. Patient's lipase is elevated at 520. Alcohol level 0.086. LFTs showed AST 388, ALT up to 41, albumin 3.7, total bilirubin 1.6. White cell count 5.6, hemoglobin 15.0, hematocrit 42.4, platelet count 152.   Patient did have a CT of the head done without contrast showing no acute intracranial process.   ASSESSMENT AND PLAN: This is a 54 year old male with a history of alcohol abuse, hypertension, gastroesophageal reflux disease, history of bipolar disorder with mania presents to the hospital with weakness, nausea, vomiting, confusion, noted to have acute hyponatremia with mild acute pancreatitis.  1. Acute hyponatremia. This is likely hypovolemic hyponatremia related to ongoing nausea, vomiting, and diarrhea complicated with ongoing alcohol abuse. I will go ahead and gently hydrate the patient with IV fluids with normal saline. Follow serial sodium levels. He has had previous seizures before but currently does not show any evidence of seizure-type activity. Will follow him clinically. If needed will use 3% saline.  2. Nausea, vomiting, abdominal pain. Likely this is related to acute pancreatitis given his alcohol abuse, underlying gastritis from his alcohol abuse. For now will continue supportive care with IV fluids, antiemetics and pain control. Also give him IV Protonix. Follow his lipase. Keep him n.p.o. for now except IV fluids, ice chips and medications.  3. Abnormal liver function tests. I suspect this is related to EtOH abuse. Will follow his liver function tests. Will get an abdominal ultrasound.  4. Tobacco abuse. Place him on a  nicotine patch.  5. Alcohol abuse. Patient is high risk for going through alcohol withdrawal. Place him on CIWA protocol. Will get a psychiatric consult for EtOH detox.  6. Hypoglycemia. I suspect this is related to starvation ketosis from his alcohol abuse. Patient presently is on a D5 drip. I will follow her serial sugars. He clinically seems to be asymptomatic presently.  7. CODE STATUS: Patient is a FULL CODE.   TIME SPENT: 50 minutes.   ____________________________ Rolly PancakeVivek J. Cherlynn KaiserSainani, MD vjs:cms D: 06/18/2012 17:53:18 ET T: 06/19/2012 06:21:23 ET JOB#: [id number]  cc: Rolly PancakeVivek J. Cherlynn KaiserSainani, MD, <Dictator>  Houston SirenVIVEK J SAINANI MD ELECTRONICALLY SIGNED 06/20/2012 20:25

## 2015-01-22 NOTE — Consult Note (Signed)
PATIENT NAME:  Jacob Velez, Jacob Velez MR#:  [id number] DATE OF BIRTH:  1961-08-27  DATE OF CONSULTATION:  06/19/2012  REFERRING PHYSICIAN:   CONSULTING PHYSICIAN:  Rayshawn Visconti K. Kaitland Lewellyn, MD  SUBJECTIVE: The patient was seen in consultation in room #260. The patient is a 54 year old white male who was laid off in July of 2013 from [employer] as he was working   and they hired too  many people. The patient is divorced for seven years after being married for seven years. The patient lives by himself in [address]. The patient comes for admission with chief complaint "I have been feeling bad for seven days. Ever since I lost my job I started drinking more alcohol and I've been drinking at a rate of 8 to 10 beers per day for the past one month and I increased it".   HISTORY OF PRESENT ILLNESS: The patient reports that he has low sodium and he takes potassium pills and in addition takes B12 and he takes atenolol for his hypertension. He has not got it filled over the past seven days and not taking the medication. He reports that he felt nauseated and upset on his stomach and he felt this way in the past when he had a seizure and he did not want to go into seizure as he was worried and so he called for help and the ambulance came and brought [facility] for [facility] ED.   PAST PSYCHIATRIC HISTORY: He was inpatient for alcohol [facility] about five years ago. He stayed sober for three years and then started drinking again. Recently he has been drinking at a rate of 8 to 10 beers per day. No history of DWI's. Never arrested for public drunkenness. Denies DT's, tremors but does admit to seizures which happened on one occasion in the past. Denies street or prescription drug abuse. Denies using IV drugs. Does admit smoking nicotine cigarettes at a rate of a pack a day for many years.   MENTAL STATUS EXAMINATION: The patient is dressed in hospital clothes, alert and oriented to place, person, and time,  very pleasant and cooperative. Denies feeling depressed. Denies feeling hopeless or helpless. Denies feeling worthless or useless. Denies having any ideas or plans to hurt himself or others. No evidence of psychosis. Denies auditory or visual hallucinations. Thought processes are logical and goal directed. Memory is intact. Cognition is intact. General knowledge and information fair. The patient does want to get help after discharge on an outpatient basis from AA meetings for his alcohol problems and he realizes that he has a problem and he needs help. Insight and judgment fair.    IMPRESSION: AXIS I: 1. Alcohol dependence, chronic, continuous. 2. Adjustment disorder with depressed mood.  3. Adjustment disorder with alcohol drinking ever since he was laid off from [employer].   PLAN: 1. Continue CIWA protocol.  2. Continue detox.  3. The patient is eager to get help from [employer] meetings in [place], [place] as he used to go to them and he relates well with them. The patient plans to call them back and get help as needed.  4. Please request Social Services consult so that programs in the community can be explained to the patient and he can be referred to appropriate program after discharge from the hospital.   ____________________________ Jannet MantisSurya K. Guss Bundehalla, MD skc:drc D: 06/19/2012 19:19:09 ET T: 06/20/2012 07:17:28 ET JOB#: [id number]  cc: Monika SalkSurya K. Guss Bundehalla, MD, <Dictator> Beau FannySURYA K Carlena Ruybal MD ELECTRONICALLY SIGNED  06/25/2012 19:42 

## 2015-01-22 NOTE — Consult Note (Signed)
Details:    - Psychiatry: Patient seen last night for follow up. He presented as feeling much better than the previous night. He had no tremor and his vitals are stable. His pain is under better control and he is able to eat without difficulty.  Affect calmer and mood stated as less anxious and depressed. Denied SI and did not appear to be psychotic.  Patient does have an alcohol problem. Educatin done about the danger of drinking including death from pancreatitis. He does not want inpt SA treatment and is not so unstable as to medically need inpt treatment for detox. I agree with primary attending about dischargeing him with 2 days worth of ativan 1mg  bid and that is all. Ref[facility] to [facility] services at [address], [phone] for follow up treatment.  He also has bipolar disorder and has been treatment noncomplient due to poor insight but says today he would be willing to go for follow-up treatment for that. Since he is leaving today I would not suggest starting Li right now since he is aasymptomatic but refer him again to [facility] for outpt [facility].   Electronic Signatures: Taji Barretto, Jackquline DenmarkJohn T (MD)  (Signed 22-Sep-13 11:39)  Authored: Details   Last Updated: 22-Sep-13 11:39 by Audery Amellapacs, Karalynn Cottone T (MD)

## 2015-01-22 NOTE — Consult Note (Signed)
PATIENT NAME:  Jacob Velez, Jacob Velez MR#:  [id number] DATE OF BIRTH:  11/07/1960  DATE OF CONSULTATION:  06/20/2012  REFERRING PHYSICIAN:  Enid Baasadhika Kalisetti, MD  CONSULTING PHYSICIAN:  Lutricia FeilPaul Oh, MD/Atalie Oros Mort SawyersS. Atley Scarboro, NP PRIMARY CARE PHYSICIAN: No one locally, to be establishing with Dr. Dossie Arbourrissman.    REASON FOR CONSULTATION: Alcoholic pancreatitis, dilated common bile duct on CT scan.   HISTORY OF PRESENT ILLNESS: Jacob Velez is a 54 year old Caucasian gentleman who presented to [facility] Emergency Room on 06/18/2012 for the concern of a one-week history of weakness, nausea and vomiting, with associated abdominal pain. The patient states for a week prior to presenting to the Emergency Room that any time that he ate or drank he would immediately experience vomiting. No hematemesis. Abdominal pain has been predominantly to upper quadrants of abdomen. No associated radiating pain through to his back or around his sides. He does state that the pain is very similar to when he was diagnosed with Salmonella in 1993. Additionally, he has noticed a change of bowel habits, experiencing diarrhea for the past week and again every time that he eats or drinks he ends up defecating. Consistency of stool is watery. No melena. No rectal bleeding. Known history of reflux for which he is on omeprazole 20 mg daily and states that he is doing well, symptoms are well controlled. History of EGD as well as a colonoscopy done in 1999 [facility] [place]. He states that internal hemorrhoids were noted at that time, otherwise unremarkable. Due to the extreme weakness as well as vomiting. The patient had called EMS to be transported to the Emergency Room. Upon presenting, he was found to have a sodium of 114 with abnormal liver function studies and slightly elevated lipase level consistent with pancreatitis. On admission, abdominal pain was more epigastric according to History and Physical and intensity  was a 5 to 10 on a scale of 1 to 10. Currently he has just a soreness to his left upper quadrant of abdomen. He did have an abdominal ultrasound done on 06/19/2012 which revealed a small amount of sludge within the gallbladder: No sonographic graphic evidence of acute cholecystitis. The common bile duct was mildly dilated. Findings were raised about possibly hepatic steatosis. A subcentimeter mass of the left kidney represented a cyst. A CT of head revealed no acute intracranial process.   PAST MEDICAL HISTORY: Significant for: 1. Alcohol abuse. 2. Bipolar disorder with mania.  3. Seizure disorder in 1996, seizure related to hyponatremia.  4. Known history of hyponatremia.  5. Hypertension.  6. Reflux.   PAST SURGICAL HISTORY: Hip replacement 02/18/2011, right.   ALLERGIES: None.   HOME MEDICATIONS:  1. Atenolol 100 mg daily. 2. Omeprazole 20 mg a day.  3. Potassium 20 mEq a day.  4. Visine as needed.  5. Vitamin B12 1000 mcg daily.  6. Aleve as directed as needed.  7. Over-the-counter NyQuil as needed.   SOCIAL HISTORY: He drinks 10 to 12 years beers daily. History of heavy alcohol consumption in the past, only recently resumed two months ago with the loss of his job. He smokes 1 pack of cigarettes daily. No recreational drug use. Currently a history of marijuana use during college. Currently unemployed.   FAMILY HISTORY: Mother with history of seizure disorder. Father with history of stroke. No history of neoplasm, specifically colon cancer, hepatic or pancreatic concerns. No history of colonic polyps.   REVIEW OF SYSTEMS: CONSTITUTIONAL: No fevers. No weight gain, weight loss. EYES:  No blurred vision, double vision. ENT: No tinnitus. No postnasal drip. No redness. No sore throat. RESPIRATORY: No coughing, no wheezing, no hemoptysis, no dyspnea. CARDIOVASCULAR: No chest pain. No heart palpitations, syncope. GASTROINTESTINAL: See History of Present Illness. GENITOURINARY: No dysuria or  hematuria. ENDOCRINE: No polyuria, nocturia, heat or cold intolerance. HEMATOLOGIC/LYMPHATIC: No history of anemia. Denies significant easy bruising and bleeding. INTEGUMENTARY: No rashes. No lesions. MUSCULOSKELETAL: No neuralgias or myalgias. NEUROLOGIC: History of seizure disorder related to hyponatremia. No history of cerebrovascular accident or transient ischemic attack. PSYCHIATRIC: Known history of bipolar disorder with mania.     PHYSICAL EXAMINATION:  VITAL SIGNS: Temperature is 98.2, pulse is 99, respirations 20, blood pressure 143/100 with pulse oximetry of 100%.   GENERAL: Well developed, well nourished 54 year old Caucasian gentleman in no acute distress noted sitting in recliner, able to ambulate without difficulty.   HEENT: Normocephalic, atraumatic. Pupils are reactive to light. Conjunctivae clear. Sclerae anicteric.   NECK: Supple. Trachea midline. No lymphadenopathy or thyromegaly.   PULMONARY: Symmetric rise and fall of chest. Clear to auscultation throughout.   ABDOMEN: Soft, nondistended. Mild discomfort left upper quadrant. No bruits. No masses.   RECTAL: Deferred.   MUSCULOSKELETAL: Moving all four extremities. No contractures. No clubbing.   EXTREMITIES: No edema.   PSYCHIATRIC: Alert and oriented x4. Memory grossly intact. Appropriate affect and mood.   NEUROLOGIC: No gross neurological deficits.   LABORATORY, DIAGNOSTIC, AND RADIOLOGICAL DATA: Chemistry panel: Glucose was low at 52 on admission with a BUN of 2, sodium was 114, chloride was 78, CO2 was 15. Calcium was low at 8.0, magnesium 1.8 with lipase level 520. Ethanol level was 86. Sodium has been corrected to level of 130 today, glucose is 117. Potassium has dropped from 4.2 to 3.1. Sodium chloride is 97, potassium remains low but slightly improved to 8.2. Lipase was 426 yesterday and today 911. Hepatic panel: Total serum albumin level was 3.7 on admission, total bilirubin 1.6, AST is 388 with an ALT of 241.  Albumin level yesterday was 1.4 but today 0.9. Potassium has declined to 6.0 with a serum albumin level of 3.2, AST is 216 with an ALT of 158. Urine drug screen is unremarkable. Hemoglobin 15.0 with hematocrit of 42.4 on admission, which has dropped to 13.0 and 36.0. Platelet count 152,000 which has dropped to 114,000.     IMPRESSION: Acute pancreatitis with possible underlying evidence of chronic pancreatitis given the elevation in transaminase levels as well as the finding on abdominal ultrasound of mildly dilated common bile duct. Concern does exist with also the finding of a small amount of sludge within the gallbladder. Possible gallbladder-induced pancreatitis as well. Thus, differential is inclusive of acute pancreatitis related to chronic alcohol abuse versus possible gallstone-induced.    PLAN: The patient's presentation was discussed with Dr. Lutricia Feil. Recommendation is to proceed in a fashion as M.R.C.P. has been ordered for today, pending results. We will continue to monitor the patient's hemodynamic studies as well as laboratory results. Given the noted rise in lipase level since admission from 05/20 to 09/11, the patient is to remain n.p.o. except for ice chips at this time.   These services provided by Rodman Key, MS, APRN, [facility], FNP,  under collaborative agreement with Lutricia Feil, MD.      ____________________________ Rodman Key, NP dsh:cbb D: 06/20/2012 15:25:51 ET T: 06/20/2012 17:50:53 ET JOB#: [id number]  cc: Rodman Key, NP, <Dictator> Rodman Key MD ELECTRONICALLY SIGNED 06/22/2012 8:20

## 2015-01-22 NOTE — Consult Note (Signed)
Brief Consult Note: Diagnosis: alcohol dependence, bipolar disorder.   Patient was seen by consultant.   Consult note dictated.   Recommend further assessment or treatment.   Orders entered.   Comments: Psychiatry: Patient seen. Will dictate note. Pt known to me from prior inpt treatment. Currently he looks like he may be starting to have DTs as well as being emotionally unstable. If needed, [facility] BUT I'm concerned about how medically stable he is since his lipase is back up today and he is still having pain with eating. Will follow tomorrow. Also gave him an extra ativan dose tonight.  Electronic Signatures: Mechel Haggard, Jackquline DenmarkJohn T (MD)  (Signed 20-Sep-13 19:41)  Authored: Brief Consult Note   Last Updated: 20-Sep-13 19:41 by Audery Amellapacs, Beauford Lando T (MD)

## 2015-01-22 NOTE — Consult Note (Signed)
Chief Complaint:   Subjective/Chief Complaint Doing well. Started full liquid diet. No abd pain or nausea.   VITAL SIGNS/ANCILLARY NOTES: **Vital Signs.:   18-Sep-13 04:24   Vital Signs Type Routine   Temperature Temperature (F) 97.9   Celsius 36.6   Temperature Source Oral   Pulse Pulse 93   Respirations Respirations 20   Systolic BP Systolic BP 144   Diastolic BP (mmHg) Diastolic BP (mmHg) 75   Mean BP 98   Pulse Ox % Pulse Ox % 98   Pulse Ox Activity Level  At rest   Oxygen Delivery Room Air/ 21 %   Brief Assessment:   Cardiac Regular    Respiratory clear BS    Gastrointestinal Normal   Lab Results: Routine Chem:  18-Sep-13 06:05    Sodium, Serum  130 (Result(s) reported on 22 Jun 2012 at 06:59AM.)   Lipase  409 (Result(s) reported on 22 Jun 2012 at [facility]06:59AM.)   Radiology Results: MRI:    16-Sep-13 20:04, MRCP MR Cholangiogram   MRCP MR Cholangiogram    REASON FOR EXAM:    dilated CBD and acute pancreatitis  COMMENTS:       PROCEDURE: MR  - MR CHOLANGIOGRAM  - Jun 20 2012  8:04PM     RESULT:     Technique: Axial and coronal source imaging was obtained. 3-D maximum   intensity projections of the biliary system were reformatted.    Findings: There is diffuse dilation of the common bile duct measuring 8.2   mm in diameter. There is no evidence of filling defects nor focal or   segmental narrowing. The pancreatic duct is slightly prominent at 2.3 mm  without evidence of filling defects, focal outpouchings nor segmental or   focal narrowing. The gallbladder is appreciated without evidence of     filling defects. There is normal caliber of the intrahepatic biliary   ducts.    No MR evidence of masses, free fluid nor loculated fluid collections are   identified within the limited evaluation of the abdomen. Limited   evaluation of the pancreas is unremarkable. The common bile duct and   pancreatic duct are identified to the level of the  ampulla.    IMPRESSION:    1. Dilated common bile duct without MR evidence of an etiology.  2. No further abnormalities are appreciated. If there is persistent   clinical concern, further evaluation with ERCP is recommended.    Thank you for the opportunity to contribute to the care of your patient.     Verified By: Jani FilesHECTOR W. COOPER, M.D., MD   Assessment/Plan:  Assessment/Plan:   Assessment Pancreatitis. Resolving clinically.    Plan If tolerates low fat diet, ok for discharge tomorrow. If sxs worsen again, then order CT of pancreas with contrast to visualize pancreas better. Will sign off. Thanks.   Electronic Signatures: Lutricia Feilh, Chieko Neises (MD)  (Signed 18-Sep-13 13:14)  Authored: Chief Complaint, VITAL SIGNS/ANCILLARY NOTES, Brief Assessment, Lab Results, Radiology Results, Assessment/Plan   Last Updated: 18-Sep-13 13:14 by Lutricia Feilh, Cervando Durnin (MD)

## 2015-01-22 NOTE — Consult Note (Signed)
Brief Consult Note: Diagnosis: alcohol dep.   Comments: Psychiatry: Came by to see patient. He is indisposed in the bathroom. Reviewed chart. Will try to check back with him tomorrow.  Electronic Signatures: Clapacs, Jackquline DenmarkJohn T (MD)  (Signed 20-Sep-13 19:18)  Authored: Brief Consult Note   Last Updated: 20-Sep-13 19:18 by Audery Amellapacs, John T (MD)

## 2015-01-22 NOTE — Discharge Summary (Signed)
PATIENT NAME:  Jacob Velez, Jacob Velez MR#:  [id number] DATE OF BIRTH:  04-04-1961  DATE OF ADMISSION:  06/18/2012 DATE OF DISCHARGE:  06/26/2012  PRIMARY CARE PHYSICIAN: None.   HOSPITAL CONSULTATIONS:   1. GI consultation by Dr. Lutricia Feil.  2. Psychiatric consultation by Dr. Guss Bunde and Dr. Toni Amend.   DISCHARGE DIAGNOSES:  1. Acute pancreatitis. 2. Alcohol dependence. 3. Alcohol withdrawal. 4. 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 5. Acute hyponatremia.  6. Tobacco use disorder.   LABORATORY, DIAGNOSTIC AND RADIOLOGICAL DATA: Ultrasound of the abdomen showed a small amount of sludge within the gallbladder. No evidence of acute cholecystitis. The common bile duct is mildly dilated. Possibility of hepatic cirrhosis is present, and a subcentimeter mass, left kidney, likely representing a cyst is present. MRCP showing dilated common bile duct without MRI evidence of any etiology. No further abnormality appreciated. Urinalysis negative for any infection. Urine toxicity screen on admission was negative. CT of the head on admission shows no acute intracranial process. Alcohol level on admission was 86, and lipase on admission was 520, which went the next day up to 900, and then slowly came down.   HOSPITAL COURSE: Mr. Albrecht is a 54 year old Caucasian male with past medical history significant for smoking and alcohol use, history of gastroesophageal reflux disease and bipolar disorder, presented to the hospital secondary to nausea, vomiting, and abdominal pain and altered mental status. He was found having elevated lipase and hyponatremia with sodium, and he was admitted to the hospital.   1. Acute hyponatremia from poor intake from ongoing alcohol use: He was extremely hypovolemic, so IV hydration with normal saline was done and sodium level came back up to 134.  2. Acute pancreatitis with elevated lipase: The patient was initially kept n.p.o. and then gradually advanced to clear  liquids and then solid diet as he tolerated. Initially, he needed pain medication and medicine for his nausea, bit then for the last two to three days he was off all his medication and tolerating a solid diet without any problem.  3. Elevated liver function tests: Secondary to hepatic steatosis likely due to alcohol use.   His GI consult was done, and they suggested no further work-up at this time, advised him to stop drinking alcohol.  4. Smoking: He was counseled about smoking cessation during the admission and placed on nicotine patch during the hospital stay.  5. Alcohol abuse and withdrawal: The patient was having binge drinking lately for the last few months due to depression. He was free of alcohol before that, but  then lately because he lost his job and moved from [place] to [place] that is why he had all these issues and started drinking again. When he came he was having high alcohol level in his blood. After 2 to 3 days he started having withdrawal symptoms which were managed by IV Ativan as needed. Psychiatry consult was also done because he had a history of admission to Behavioral Medicine in the past for his withdrawal symptoms, but he slowly improved over 3 to 4 days in his withdrawal symptoms; and on the final day Psychiatry follow up was done and he was no longer needing any inpatient management in Behavioral Medicine for his withdrawal symptoms, and he was discharged home with prescriptions of anxiolytic medications as needed for 2 to 3 days.  CONDITION ON DISCHARGE: Satisfactory.  CODE STATUS:  FULL CODE.     ALLERGIES: No known allergies.   DISCHARGE MEDICATIONS:  1. Omeprazole 20 mg  delayed-release tablet once a day. 2. Vitamin B12 1000 mcg tablet once a day.  3. Lorazepam 1 mg oral tablet as needed every 8 to 12 hours for 2 days for anxiety and nervousness.  4. Metoprolol 25 mg tablet, take 1/2 tablet every 12 hours for 30 days.  5. Magnesium oxide 400 mg, 1 tablet  orally once a day.  6. Nicotine patch 21 mg transdermal patch extended release, 1 patch once a day for 7 days. 7. Folic acid 1 mg once a day for 30 days. 8. Thiamine 50 mg orally once a day for 30 days.   DIET: He was advised to take a low sodium diet for his hypertension. Diet consistency regular. ACTIVITY: As tolerated.   REFERRAL: To [facility] at [address], [phone], for his psychiatric follow-up treatment within a week.  ____________________________ Hope PigeonVaibhavkumar G. Elisabeth PigeonVachhani, MD vgv:cbb D: 06/26/2012 17:20:21 ET T: 06/28/2012 11:04:27 ET JOB#: [id number]  cc: Hope PigeonVaibhavkumar G. Elisabeth PigeonVachhani, MD, <Dictator> Altamese DillingVAIBHAVKUMAR Nikoli Nasser MD ELECTRONICALLY SIGNED 06/28/2012 15:33

## 2015-01-22 NOTE — Consult Note (Signed)
Brief Consult Note: Diagnosis: Acute pancreatitis.  Possible underlying evidence of chronic pancreatitis.  Noted eleation of Lipase level which as continued to rise.  Alcohol aubuse contributatory factor but finding of small amount of sludge within gallbladder and common bile duct mildly dilated.   Consult note dictated.   Discussed with Attending MD.   Comments: Patient's presentation discussed with Dr. Lutricia FeilPaul Oh.  Pending MRCP results.  Finding of common bile duct dilatation possible in correlation with chronic pancreatitis which patient is at risk for given history of chronic alcohol abuse.  Will await findings.  Continue to monitor hemodynamic status as well as laboratory results.  He needs to remain NPO except for ice chips especially given continued rise in lipase level.  Continue with IV hydration and pain management.  Electronic Signatures: Rodman KeyHarrison, Dawn S (NP)  (Signed 16-Sep-13 15:27)  Authored: Brief Consult Note   Last Updated: 16-Sep-13 15:27 by Rodman KeyHarrison, Dawn S (NP)

## 2015-06-02 ENCOUNTER — Other Ambulatory Visit: Payer: Self-pay | Admitting: Internal Medicine

## 2015-06-17 ENCOUNTER — Ambulatory Visit: Payer: Self-pay | Admitting: Internal Medicine

## 2015-06-21 ENCOUNTER — Encounter (INDEPENDENT_AMBULATORY_CARE_PROVIDER_SITE_OTHER): Payer: Self-pay

## 2015-06-21 ENCOUNTER — Ambulatory Visit (INDEPENDENT_AMBULATORY_CARE_PROVIDER_SITE_OTHER): Payer: Self-pay | Admitting: Internal Medicine

## 2015-06-21 ENCOUNTER — Encounter: Payer: Self-pay | Admitting: Internal Medicine

## 2015-06-21 VITALS — BP 140/88 | HR 75 | Temp 98.0°F | Resp 12 | Ht 66.0 in | Wt 123.5 lb

## 2015-06-21 DIAGNOSIS — Z716 Tobacco abuse counseling: Secondary | ICD-10-CM

## 2015-06-21 DIAGNOSIS — E871 Hypo-osmolality and hyponatremia: Secondary | ICD-10-CM

## 2015-06-21 DIAGNOSIS — F1010 Alcohol abuse, uncomplicated: Secondary | ICD-10-CM | POA: Insufficient documentation

## 2015-06-21 DIAGNOSIS — I10 Essential (primary) hypertension: Secondary | ICD-10-CM

## 2015-06-21 LAB — COMPREHENSIVE METABOLIC PANEL
ALBUMIN: 4.6 g/dL (ref 3.5–5.2)
ALT: 17 U/L (ref 0–53)
AST: 33 U/L (ref 0–37)
Alkaline Phosphatase: 46 U/L (ref 39–117)
BUN: 3 mg/dL — AB (ref 6–23)
CALCIUM: 9 mg/dL (ref 8.4–10.5)
CO2: 28 mEq/L (ref 19–32)
CREATININE: 0.56 mg/dL (ref 0.40–1.50)
Chloride: 86 mEq/L — ABNORMAL LOW (ref 96–112)
GFR: 162.59 mL/min (ref >60.00)
Glucose, Bld: 52 mg/dL — ABNORMAL LOW (ref 70–99)
Potassium: 4.2 mEq/L (ref 3.5–5.1)
Sodium: 122 mEq/L — ABNORMAL LOW (ref 135–145)
Total Bilirubin: 0.7 mg/dL (ref 0.2–1.2)
Total Protein: 6.9 g/dL (ref 6.0–8.3)

## 2015-06-21 LAB — CBC WITH DIFFERENTIAL/PLATELET
BASOS ABS: 0.1 10*3/uL (ref 0.0–0.1)
Basophils Relative: 0.9 % (ref 0.0–3.0)
Eosinophils Absolute: 0.4 10*3/uL (ref 0.0–0.7)
Eosinophils Relative: 7.7 % — ABNORMAL HIGH (ref 0.0–5.0)
HEMATOCRIT: 47.1 % (ref 39.0–52.0)
Hemoglobin: 16.2 g/dL (ref 13.0–17.0)
Lymphocytes Relative: 42 % (ref 12.0–46.0)
Lymphs Abs: 2.4 10*3/uL (ref 0.7–4.0)
MCHC: 34.5 g/dL (ref 30.0–36.0)
MCV: 105.1 fl — AB (ref 78.0–100.0)
MONOS PCT: 9.8 % (ref 3.0–12.0)
Monocytes Absolute: 0.6 10*3/uL (ref 0.1–1.0)
NEUTROS ABS: 2.3 10*3/uL (ref 1.4–7.7)
Neutrophils Relative %: 39.6 % — ABNORMAL LOW (ref 43.0–77.0)
PLATELETS: 299 10*3/uL (ref 150.0–400.0)
RBC: 4.48 Mil/uL (ref 4.22–5.81)
RDW: 11.9 % (ref 11.5–15.5)
WBC: 5.8 10*3/uL (ref 4.0–10.5)

## 2015-06-21 LAB — LIPASE: Lipase: 82 U/L — ABNORMAL HIGH (ref 11.0–59.0)

## 2015-06-21 LAB — VITAMIN B12: Vitamin B-12: 265 pg/mL (ref 211–911)

## 2015-06-21 LAB — TSH: TSH: 0.63 u[IU]/mL (ref 0.35–4.50)

## 2015-06-21 MED ORDER — CHLORDIAZEPOXIDE HCL 25 MG PO CAPS: ORAL_CAPSULE | ORAL | Status: DC

## 2015-06-21 NOTE — Patient Instructions (Addendum)
I am treating you with Librium (chlordiazepoxide) over the next several days   You MUST RETURN ON Monday for follow up  So adjustments can be made  IF YOU DEVELOP "TWITCHINESS" OR VOMITING, CALL 911 OR GO TO THE ER FOR HELP   Continue your atenolol   Do not worry about quitting smoking right now

## 2015-06-21 NOTE — Progress Notes (Signed)
Pre-visit discussion using our clinic review tool. No additional management support is needed unless otherwise documented below in the visit note.  

## 2015-06-21 NOTE — Progress Notes (Signed)
Patient ID: Jacob Velez, male    DOB: [birth year]  Age: 54 y.o. MRN: [id number]   CC: The primary encounter diagnosis was Alcohol abuse. Diagnoses of Essential hypertension, benign, Tobacco abuse counseling, and Hyponatremia were also pertinent to this visit.   Relapse in alcohol abuse.  Startee drinking 3 weeks ago when it became apparent that he was not going to receive any proceeds from the selling of his prior home by his x wife. Staes that he is drinking about a six pack per night  Incessant talking,  Not sleeping,  "not more than a 6 pack at night." but friend Jacob Velez says he alcohol intake may be greater than what was described.  Patient works as a Community education officer, and he is planing to move back into with mother.    History Jacob Velez has a past medical history of Hypertension; Seizures (2004); and H/O staphylococcal infection (Childhood).   He has past surgical history that includes Joint replacement (Right, 2012).   His family history includes Seizures in his mother; Stroke (age of onset: 32) in his father. There is no history of Cancer.He reports that he has been smoking.  He has never used smokeless tobacco. He reports that he drinks alcohol. He reports that he does not use illicit drugs.  Outpatient Prescriptions Prior to Visit  Medication Sig Dispense Refill  . atenolol (TENORMIN) 100 MG tablet TAKE 1 TABLET (100 MG TOTAL) BY MOUTH 2 (TWO) TIMES DAILY. 180 tablet 0  . fexofenadine (ALLEGRA) 180 MG tablet Take 1 tablet (180 mg total) by mouth daily. 90 tablet 11  . atenolol (TENORMIN) 100 MG tablet TAKE 1 TABLET (100 MG TOTAL) BY MOUTH 2 (TWO) TIMES DAILY. 180 tablet 1  . varenicline (CHANTIX CONTINUING MONTH PAK) 1 MG tablet Take 1 tablet (1 mg total) by mouth 2 (two) times daily. (Patient not taking: Reported on 06/21/2015) 60 tablet 2  . varenicline (CHANTIX STARTING MONTH PAK) 0.5 MG X 11 & 1 MG X 42 tablet Take one 0.5 mg tablet by mouth once daily for 3 days, then increase  to one 0.5 mg tablet twice daily for 4 days, then increase to one 1 mg tablet twice daily. (Patient not taking: Reported on 06/21/2015) 53 tablet 0   Facility-Administered Medications Prior to Visit  Medication Dose Route Frequency Provider Last Rate Last Dose  . TDaP (BOOSTRIX) injection 0.5 mL  0.5 mL Intramuscular Once Raquel Conni Elliot, NP        Review of Systems   Patient denies headache, fevers, malaise,  pain, sore throat, dysphagia,  hemoptysis , cough, dyspnea, wheezing, chest pain, palpitations, orthopnea, edema, abdominal pain, nausea, melena, diarrhea, constipation, flank pain, dysuria, hematuria, urinary  Frequency, nocturia, numbness, tingling, seizures,  Focal weakness, Loss of consciousness,  Tremor, insomnia, depression, anxiety, and suicidal ideation.      Objective:  BP 140/88 mmHg  Pulse 75  Temp(Src) 98 F (36.7 C) (Oral)  Resp 12  Ht  (1.676 m)  Wt 123 lb 8 oz (56.019 kg)  BMI 19.94 kg/m2  SpO2 98%  Physical Exam   General appearance: alert, cooperative and appears stated age Ears: normal TM's and external ear canals both ears Throat: lips, mucosa, and tongue normal; teeth and gums normal Neck: no adenopathy, no carotid bruit, supple, symmetrical, trachea midline and thyroid not enlarged, symmetric, no tenderness/mass/nodules Back: symmetric, no curvature. ROM normal. No CVA tenderness. Lungs: clear to auscultation bilaterally Heart: regular rate and rhythm, S1, S2 normal, no murmur, click,  rub or gallop Abdomen: soft, non-tender; bowel sounds normal; no masses,  no organomegaly Pulses: 2+ and symmetric Skin: Skin color, texture, turgor normal. No rashes or lesions Lymph nodes: Cervical, supraclavicular, and axillary nodes normal.    Assessment & Plan:   Problem List Items Addressed This Visit      Unprioritized   Tobacco abuse counseling    Suggested suspedning any plans to quit smoking while he is detoxicng from alcohol.       Essential  hypertension, benign    Continue atenolol bid.       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 Will repeat on Monday       Alcohol abuse - Primary    Patient is requesting help with detox.  Librium taper ordered with dose reduction every 48 hours starting with 25-50 mg every 8 hours .  He is also given printed information about librium alcohol withdrawal and lists of meeting for AA H ewil return on [facility]   Lab Results  Component Value Date   ALT 17 06/21/2015   AST 33 06/21/2015   ALKPHOS 46 06/21/2015   BILITOT 0.7 06/21/2015   Lab Results  Component Value Date   NA 122* 06/21/2015   K 4.2 06/21/2015   CL 86* 06/21/2015   CO2 28 06/21/2015         Relevant Orders   Vitamin B12 (Completed)   Folate RBC   TSH (Completed)   CBC with Differential/Platelet (Completed)   Comprehensive metabolic panel (Completed)   Ethanol   Lipase (Completed)   [id number] 11+Oxyco+Alc+Crt-Bund      A total of 40 minutes was spent with patient more than half of which was spent in counseling patient on the above mentioned issues , reviewing and explaining recent labs and imaging studies done, and coordination of care.   I am having Mr. Jacob Velez start on chlordiazePOXIDE. I am also having him maintain his fexofenadine, varenicline, varenicline, and atenolol. We will continue to administer Tdap.  Meds ordered this encounter  Medications  . chlordiazePOXIDE (LIBRIUM) 25 MG capsule    Sig: 1-2 capsules every 8 hours for 48 hours 1 capsule every 8 hours for 48 hours  1 capsule every 12 hours for 48 hours 1 capsule daily thereafter    Dispense:  30 capsule    Refill:  0    Medications Discontinued During This Encounter  Medication Reason  . atenolol (TENORMIN) 100 MG tablet Duplicate    Follow-up: No Follow-up on file.   Sherlene Shams, MD

## 2015-06-22 NOTE — Assessment & Plan Note (Signed)
Continue atenolol bid.

## 2015-06-22 NOTE — Assessment & Plan Note (Signed)
Patient is requesting help with detox.  Librium taper ordered with dose reduction every 48 hours starting with 25-50 mg every 8 hours .  He is also given printed information about librium alcohol withdrawal and lists of meeting for AA H ewil return on [facility]   Lab Results  Component Value Date   ALT 17 06/21/2015   AST 33 06/21/2015   ALKPHOS 46 06/21/2015   BILITOT 0.7 06/21/2015   Lab Results  Component Value Date   NA 122* 06/21/2015   K 4.2 06/21/2015   CL 86* 06/21/2015   CO2 28 06/21/2015

## 2015-06-22 NOTE — Assessment & Plan Note (Signed)
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 Will repeat on Monday

## 2015-06-22 NOTE — Assessment & Plan Note (Signed)
Suggested suspedning any plans to quit smoking while he is detoxicng from alcohol.

## 2015-06-23 LAB — FOLATE RBC: RBC Folate: 509 ng/mL (ref >280)

## 2015-06-24 ENCOUNTER — Emergency Department
Admission: EM | Admit: 2015-06-24 | Discharge: 2015-06-24 | Discharge status: Against Medical Advice | Payer: No Typology Code available for payment source | Attending: Emergency Medicine | Admitting: Emergency Medicine

## 2015-06-24 ENCOUNTER — Ambulatory Visit: Payer: Self-pay | Admitting: Internal Medicine

## 2015-06-24 ENCOUNTER — Encounter: Payer: Self-pay | Admitting: Emergency Medicine

## 2015-06-24 DIAGNOSIS — I10 Essential (primary) hypertension: Secondary | ICD-10-CM | POA: Insufficient documentation

## 2015-06-24 DIAGNOSIS — G4700 Insomnia, unspecified: Secondary | ICD-10-CM | POA: Insufficient documentation

## 2015-06-24 DIAGNOSIS — Z720 Tobacco use: Secondary | ICD-10-CM | POA: Insufficient documentation

## 2015-06-24 LAB — ETHANOL, CLINICAL

## 2015-06-24 NOTE — ED Notes (Signed)
Spoke with dr York Cerise regarding pt and no orders received at this time.  Dr York Cerise states not to draw blood or collect urine at this time.

## 2015-06-24 NOTE — ED Notes (Signed)
Pt to ed with c/o wanting to be voluntarily committed.  Pt states his mother called the police because she is worried about him.  Pt's mother told police that he has been threatening to kill himself multiple times.  At triage pt is denying SI, denies HI, denies hallucinations.  Pt states he usually uses alcohol 3 times a night.  Pt reports he has not been sleeping well over the last few weeks.

## 2015-06-25 ENCOUNTER — Encounter: Payer: Self-pay | Admitting: Emergency Medicine

## 2015-06-25 ENCOUNTER — Inpatient Hospital Stay
Admission: EM | Admit: 2015-06-25 | Discharge: 2015-06-26 | DRG: 897 | Disposition: A | Discharge status: Home / Self Care | Payer: No Typology Code available for payment source | Attending: Internal Medicine | Admitting: Internal Medicine

## 2015-06-25 DIAGNOSIS — F10229 Alcohol dependence with intoxication, unspecified: Principal | ICD-10-CM | POA: Diagnosis present

## 2015-06-25 DIAGNOSIS — F17210 Nicotine dependence, cigarettes, uncomplicated: Secondary | ICD-10-CM | POA: Diagnosis present

## 2015-06-25 DIAGNOSIS — R45851 Suicidal ideations: Secondary | ICD-10-CM

## 2015-06-25 DIAGNOSIS — Y908 Blood alcohol level of 240 mg/100 ml or more: Secondary | ICD-10-CM | POA: Diagnosis present

## 2015-06-25 DIAGNOSIS — Z79899 Other long term (current) drug therapy: Secondary | ICD-10-CM

## 2015-06-25 DIAGNOSIS — F1290 Cannabis use, unspecified, uncomplicated: Secondary | ICD-10-CM | POA: Diagnosis present

## 2015-06-25 DIAGNOSIS — G8929 Other chronic pain: Secondary | ICD-10-CM | POA: Diagnosis present

## 2015-06-25 DIAGNOSIS — R569 Unspecified convulsions: Secondary | ICD-10-CM

## 2015-06-25 DIAGNOSIS — F309 Manic episode, unspecified: Secondary | ICD-10-CM | POA: Diagnosis present

## 2015-06-25 DIAGNOSIS — E871 Hypo-osmolality and hyponatremia: Secondary | ICD-10-CM | POA: Diagnosis present

## 2015-06-25 DIAGNOSIS — M549 Dorsalgia, unspecified: Secondary | ICD-10-CM | POA: Diagnosis present

## 2015-06-25 DIAGNOSIS — F10929 Alcohol use, unspecified with intoxication, unspecified: Secondary | ICD-10-CM | POA: Diagnosis present

## 2015-06-25 DIAGNOSIS — Z96641 Presence of right artificial hip joint: Secondary | ICD-10-CM | POA: Diagnosis present

## 2015-06-25 DIAGNOSIS — F319 Bipolar disorder, unspecified: Secondary | ICD-10-CM

## 2015-06-25 DIAGNOSIS — I10 Essential (primary) hypertension: Secondary | ICD-10-CM | POA: Diagnosis present

## 2015-06-25 DIAGNOSIS — G40909 Epilepsy, unspecified, not intractable, without status epilepticus: Secondary | ICD-10-CM | POA: Diagnosis present

## 2015-06-25 DIAGNOSIS — F1029 Alcohol dependence with unspecified alcohol-induced disorder: Secondary | ICD-10-CM

## 2015-06-25 HISTORY — DX: Bipolar disorder, unspecified: F31.9

## 2015-06-25 HISTORY — DX: Alcohol abuse, uncomplicated: F10.10

## 2015-06-25 LAB — CBC
HCT: 47.6 % (ref 40.0–52.0)
Hemoglobin: 16.8 g/dL (ref 13.0–18.0)
MCH: 37 pg — AB (ref 26.0–34.0)
MCHC: 35.3 g/dL (ref 32.0–36.0)
MCV: 104.8 fL — AB (ref 80.0–100.0)
PLATELETS: 263 10*3/uL (ref 150–440)
RBC: 4.54 MIL/uL (ref 4.40–5.90)
RDW: 11.7 % (ref 11.5–14.5)
WBC: 5.2 10*3/uL (ref 3.8–10.6)

## 2015-06-25 LAB — URINE DRUG SCREEN, QUALITATIVE (ARMC ONLY)
Amphetamines, Ur Screen: NOT DETECTED
BARBITURATES, UR SCREEN: NOT DETECTED
BENZODIAZEPINE, UR SCRN: NOT DETECTED
CANNABINOID 50 NG, UR ~~LOC~~: POSITIVE — AB
Cocaine Metabolite,Ur ~~LOC~~: NOT DETECTED
MDMA (ECSTASY) UR SCREEN: NOT DETECTED
Methadone Scn, Ur: NOT DETECTED
Opiate, Ur Screen: NOT DETECTED
PHENCYCLIDINE (PCP) UR S: NOT DETECTED
TRICYCLIC, UR SCREEN: NOT DETECTED

## 2015-06-25 LAB — COMPREHENSIVE METABOLIC PANEL
ALBUMIN: 4.7 g/dL (ref 3.5–5.0)
ALT: 28 U/L (ref 17–63)
AST: 69 U/L — AB (ref 15–41)
Alkaline Phosphatase: 55 U/L (ref 38–126)
Anion gap: 12 (ref 5–15)
CHLORIDE: 84 mmol/L — AB (ref 101–111)
CO2: 24 mmol/L (ref 22–32)
CREATININE: 0.62 mg/dL (ref 0.61–1.24)
Calcium: 8.7 mg/dL — ABNORMAL LOW (ref 8.9–10.3)
GFR calc Af Amer: >60 mL/min (ref >60)
GFR calc non Af Amer: >60 mL/min (ref >60)
Glucose, Bld: 86 mg/dL (ref 65–99)
POTASSIUM: 4.1 mmol/L (ref 3.5–5.1)
SODIUM: 120 mmol/L — AB (ref 135–145)
Total Bilirubin: 0.4 mg/dL (ref 0.3–1.2)
Total Protein: 7.8 g/dL (ref 6.5–8.1)

## 2015-06-25 LAB — ETHANOL: ALCOHOL ETHYL (B): 242 mg/dL — AB (ref <5)

## 2015-06-25 LAB — SALICYLATE LEVEL

## 2015-06-25 LAB — ACETAMINOPHEN LEVEL: Acetaminophen (Tylenol), Serum: <10 ug/mL — ABNORMAL LOW (ref 10–30)

## 2015-06-25 MED ORDER — VITAMIN B-1 100 MG PO TABS: 100.0000 mg | ORAL_TABLET | Freq: Every day | ORAL | Status: DC

## 2015-06-25 MED ORDER — LORAZEPAM 2 MG/ML IJ SOLN: 0.0000 mg | Freq: Two times a day (BID) | INTRAMUSCULAR | Status: DC

## 2015-06-25 MED ORDER — LORAZEPAM 2 MG/ML IJ SOLN: 0.0000 mg | Freq: Four times a day (QID) | INTRAMUSCULAR | Status: DC

## 2015-06-25 MED ORDER — OLANZAPINE 10 MG PO TABS
10.0000 mg | ORAL_TABLET | Freq: Every day | ORAL | Status: DC
  Administered 2015-06-25: 10 mg via ORAL
  Filled 2015-06-25 (×2): qty 1

## 2015-06-25 MED ORDER — FOLIC ACID 1 MG PO TABS
1.0000 mg | ORAL_TABLET | Freq: Once | ORAL | Status: AC
  Administered 2015-06-25: 1 mg via ORAL
  Filled 2015-06-25: qty 1

## 2015-06-25 MED ORDER — THIAMINE HCL 100 MG/ML IJ SOLN: 100.0000 mg | Freq: Every day | INTRAMUSCULAR | Status: DC

## 2015-06-25 MED ORDER — LORAZEPAM 2 MG PO TABS
0.0000 mg | ORAL_TABLET | Freq: Two times a day (BID) | ORAL | Status: DC
  Administered 2015-06-25: 1 mg via ORAL
  Filled 2015-06-25: qty 1

## 2015-06-25 MED ORDER — NICOTINE 10 MG IN INHA
1.0000 | RESPIRATORY_TRACT | Status: DC | PRN
  Administered 2015-06-26: 05:00:00 1 via RESPIRATORY_TRACT
  Filled 2015-06-25 (×3): qty 36

## 2015-06-25 MED ORDER — LORAZEPAM 2 MG PO TABS: 0.0000 mg | ORAL_TABLET | Freq: Four times a day (QID) | ORAL | Status: DC

## 2015-06-25 MED ORDER — VITAMIN B-1 100 MG PO TABS
100.0000 mg | ORAL_TABLET | ORAL | Status: AC
  Administered 2015-06-25: 100 mg via ORAL
  Filled 2015-06-25: qty 1

## 2015-06-25 NOTE — ED Notes (Signed)
Upon arrival to unit pt complained about temperature of unit, not being given enough food, pt given sandwich tray in ED prior to coming to [facility], ED not having pudding or something else for him to eat, not receiving his home medication since he has been here.  I attempted to explain the unit rules to the pt, pt continued to try to talk over me. Stated" I am going to be up all night anyway so just tell me later". I finished orienting pt to unit. During the entire conversation pt threatened to sue myself, the hospital, the police for bringing him here, the woman that he lived with for sending him here. Pt specifially stated " if I am here more than tonight I am going to sue this place".   I verified that pt had not gotten a sandwich tray while in the QUAD. Pt given sandwich tray and drink.

## 2015-06-25 NOTE — ED Notes (Signed)
Patient assigned to appropriate care area. Patient oriented to unit/care area: Informed that, for their safety, care areas are designed for safety and monitored by security cameras at all times; and visiting hours explained to patient. Patient verbalizes understanding, and verbal contract for safety obtained.  Report received from RN Nellie  Pt brought into ED BHU via sally port by RN Nellie and Officer. PT wanded with metal detector for safety by ODS Officer Dixon. Patient oriented to unit/care area: Pt informed of unit policies and procedures.  Informed that, for their safety, care areas are designed for safety and monitored by security cameras at all times; and visiting hours explained to patient. Patient verbalizes understanding, and verbal contract for safety obtained.Pt shown to their room,  room 7.   BEHAVIORAL HEALTH ROUNDING Patient sleeping: No. Patient alert and oriented: yes Behavior appropriate: Yes.   Nutrition and fluids offered: Yes  Toileting and hygiene offered: Yes  Sitter present: q15 min observations and security camera monitoring Law enforcement present: Yes [employer]  ENVIRONMENTAL ASSESSMENT Potentially harmful objects out of patient reach: Yes.   Personal belongings secured: Yes.   Patient dressed in hospital provided attire only: Yes.   Plastic bags out of patient reach: Yes.   Patient care equipment removed: Yes.   Equipment and supplies removed: Yes.   Potentially toxic materials out of patient reach: Yes.   Sharps container removed or out of patient reach: Yes.   ED BHU PLACEMENT JUSTIFICATION Is the patient under IVC or is there intent for IVC: Yes.    Is IVC current? Yes Is the patient medically cleared: Yes.   Is there vacancy in the ED BHU: Yes.   Is the population mix appropriate for patient: Yes.   Is the patient awaiting placement in inpatient or outpatient setting: unknown at this time  Has the patient had a psychiatric consult: No .   Survey  of unit performed for contraband, proper placement and condition of furniture, tampering with fixtures in bathroom, shower, and each patient room: Yes.  ; Findings: none APPEARANCE/BEHAVIOR Cooperative,calm NEURO ASSESSMENT Orientation: A&O x4 Hallucinations: none noted at this time Speech: Normal Gait: normal RESPIRATORY ASSESSMENT Breathing Pattern-regular, no respiratory distress noted CARDIOVASCULAR ASSESSMENT Skin color appropriate for age and race GASTROINTESTINAL ASSESSMENT no GI distress noted EXTREMITIES Moves all extremities, no distress noted PLAN OF CARE Provide calm/safe environment. Vital signs assessed twice daily. ED BHU Assessment once each 12-hour shift. Collaborate with intake RN daily or as condition indicates. Assure the ED provider has rounded once each shift. Provide and encourage hygiene. Provide redirection as needed. Assess for escalating behavior; address immediately and inform ED provider.  Assess family dynamic and appropriateness for visitation as needed: Yes.  Educate the patient/family about BHU procedures/visitation: Yes.

## 2015-06-25 NOTE — ED Notes (Signed)
BEHAVIORAL HEALTH ROUNDING Patient sleeping: No. Patient alert and oriented: yes Behavior appropriate: Yes.  ; If no, describe:  Nutrition and fluids offered: Yes  Toileting and hygiene offered: Yes  Sitter present: no Law enforcement present: Yes  ENVIRONMENTAL ASSESSMENT Potentially harmful objects out of patient reach: Yes.   Personal belongings secured: Yes.   Patient dressed in hospital provided attire only: Yes.   Plastic bags out of patient reach: Yes.   Patient care equipment (cords, cables, call bells, lines, and drains) shortened, removed, or accounted for: Yes.   Equipment and supplies removed from bottom of stretcher: Yes.   Potentially toxic materials out of patient reach: Yes.   Sharps container removed or out of patient reach: Yes.   

## 2015-06-25 NOTE — ED Provider Notes (Addendum)
[facility] Emergency Department Provider Note  ____________________________________________  Time seen: Approximately 9:50 PM  I have reviewed the triage vital signs and the nursing notes.   HISTORY  Chief Complaint Behavior Problem    HPI Jacob Velez is a 54 y.o. male with a history of bipolar disorder and alcohol abuse.  He was brought in under involuntary commitment by police for reportedly threatening to hurt himself, but he states he just wants some sleep.  He has been in the emergency department recently but I do not see a note from his visit yesterday.  He has also seen Dr. Darrick Huntsman, his primary care doctor, and she advised him to start on Librium for alcohol detox, but he states that he did not fill it and has not been taking anything.  He does admit to some alcohol intake today.  He complains of chronic problems such as chronic back pain and chronic pain in his hips.  However he is very manic with pressured speech, pacing his room, and he denies suicidal ideation but clearly has no insight into his current condition.   Past Medical History  Diagnosis Date  . Hypertension   . Seizures 2004    due to dehydration, low sodium   . H/O staphylococcal infection Childhood    right leg. Chigger bite and scratched "with dirty nails"  . Alcohol abuse   . Bipolar 1 disorder     Patient Active Problem List   Diagnosis Date Noted  . Alcohol intoxication 06/26/2015  . Suicidal ideation 06/26/2015  . Seizures 06/26/2015  . Alcohol abuse 06/21/2015  . Macrocytosis without anemia 07/04/2014  . Essential hypertension, benign 07/03/2014  . Hyponatremia 07/03/2014  . Routine general medical examination at a health care facility 04/27/2013  . Tobacco use disorder 04/27/2013  . Tobacco abuse counseling 04/27/2013    Past Surgical History  Procedure Laterality Date  . Joint replacement Right 2012    right hip replacement    Current Outpatient Rx  Name   Route  Sig  Dispense  Refill  . atenolol (TENORMIN) 100 MG tablet      TAKE 1 TABLET (100 MG TOTAL) BY MOUTH 2 (TWO) TIMES DAILY.   180 tablet   0     Patient needs appointment for further refills   . chlordiazePOXIDE (LIBRIUM) 25 MG capsule      1-2 capsules every 8 hours for 48 hours 1 capsule every 8 hours for 48 hours  1 capsule every 12 hours for 48 hours 1 capsule daily thereafter   30 capsule   0   . varenicline (CHANTIX CONTINUING MONTH PAK) 1 MG tablet   Oral   Take 1 tablet (1 mg total) by mouth 2 (two) times daily. Patient not taking: Reported on 06/21/2015   60 tablet   2     Start on the 2nd month   . varenicline (CHANTIX STARTING MONTH PAK) 0.5 MG X 11 & 1 MG X 42 tablet      Take one 0.5 mg tablet by mouth once daily for 3 days, then increase to one 0.5 mg tablet twice daily for 4 days, then increase to one 1 mg tablet twice daily. Patient not taking: Reported on 06/21/2015   53 tablet   0     Allergies Review of patient's allergies indicates no known allergies.  Family History  Problem Relation Age of Onset  . Seizures Mother   . Stroke Father 48    2/2 chemicals exposed  to during [place]. He was a Occupational hygienist  . Cancer Neg Hx     Social History Social History  Substance Use Topics  . Smoking status: Current Every Day Smoker -- 0.50 packs/day for 20 years  . Smokeless tobacco: Never Used  . Alcohol Use: 0.0 oz/week    5-7 Cans of beer per week    Review of Systems Constitutional: No fever/chills Eyes: No visual changes. ENT: No sore throat. Cardiovascular: Denies chest pain. Respiratory: Denies shortness of breath. Gastrointestinal: No abdominal pain.  No nausea, no vomiting.  No diarrhea.  No constipation. Genitourinary: Negative for dysuria. Musculoskeletal: Negative for back pain. Skin: Negative for rash. Neurological: Negative for headaches, focal weakness or numbness.  10-point ROS otherwise  negative.  ____________________________________________   PHYSICAL EXAM:  VITAL SIGNS: ED Triage Vitals  Enc Vitals Group     BP 06/25/15 1732 152/88 mmHg     Pulse Rate 06/25/15 1732 69     Resp 06/25/15 1732 16     Temp --      Temp Source 06/25/15 1732 Oral     SpO2 06/25/15 1732 99 %     Weight 06/25/15 1732 124 lb (56.246 kg)     Height 06/25/15 1732  (1.651 m)     Head Cir --      Peak Flow --      Pain Score 06/25/15 1719 0     Pain Loc --      Pain Edu? --      Excl. in GC? --     Constitutional: Alert and oriented but appears manic and agitated. Eyes: Conjunctivae are normal. PERRL. EOMI. Head: Atraumatic. Nose: No congestion/rhinnorhea. Mouth/Throat: Mucous membranes are moist.  Oropharynx non-erythematous. Neck: No stridor.   Cardiovascular: Normal rate, regular rhythm. Grossly normal heart sounds.  Good peripheral circulation. Respiratory: Normal respiratory effort.  No retractions. Lungs CTAB. Gastrointestinal: Soft and nontender. No distention. No abdominal bruits. No CVA tenderness. Musculoskeletal: No lower extremity tenderness nor edema.  No joint effusions. Neurologic:  Normal speech and language. No gross focal neurologic deficits are appreciated. Ambulatory without difficulty. Skin:  Skin is warm, dry and intact. No rash noted. Psychiatric: the patient appears clearly manic with pressured and tangential speech.  He is not able to stay focused on the conversation at hand.  He is agitated.  He is denying SI/HI.  ____________________________________________   LABS (all labs ordered are listed, but only abnormal results are displayed)  Labs Reviewed  COMPREHENSIVE METABOLIC PANEL - Abnormal; Notable for the following:    Sodium 120 (*)    Chloride 84 (*)    BUN <5 (*)    Calcium 8.7 (*)    AST 69 (*)    All other components within normal limits  ETHANOL - Abnormal; Notable for the following:    Alcohol, Ethyl (B) 242 (*)    All other  components within normal limits  ACETAMINOPHEN LEVEL - Abnormal; Notable for the following:    Acetaminophen (Tylenol), Serum <10 (*)    All other components within normal limits  CBC - Abnormal; Notable for the following:    MCV 104.8 (*)    MCH 37.0 (*)    All other components within normal limits  URINE DRUG SCREEN, QUALITATIVE ([facility] ONLY) - Abnormal; Notable for the following:    Cannabinoid 50 Ng, Ur [place] POSITIVE (*)    All other components within normal limits  SALICYLATE LEVEL  SODIUM, URINE, RANDOM  OSMOLALITY  OSMOLALITY, URINE  ____________________________________________  EKG  ED ECG REPORT I, FORBACH, CORY, the attending physician, personally viewed and interpreted this ECG.  Date: 06/26/2015 EKG Time: 01:19 Rate: 72 Rhythm: normal sinus rhythm QRS Axis: normal Intervals: normal ST/T Wave abnormalities: normal Conduction Disutrbances: none Narrative Interpretation: unremarkable  ____________________________________________  RADIOLOGY   No results found.  ____________________________________________   PROCEDURES  Procedure(s) performed: none  Critical Care performed: No ____________________________________________   INITIAL IMPRESSION / ASSESSMENT AND PLAN / ED COURSE  Pertinent labs & imaging results that were available during my care of the patient were reviewed by me and considered in my medical decision making (see chart for details).  The patient appears to be suffering from an obvious manic episode.  His speech is pressured, he is tangential, and he is agitated.  It is very difficult to redirect and I will uphold his involuntary commitment at this time.  He has no insight and judgment into his current condition.  I have ordered olanzapine 10 mg by mouth to help with his mental state.  Additionally, the patient is severely hyponatremic with a history of beer potomania.  He is at high risk for refractory seizures and needs very slow correction  of his hyponatremia via slow IV infusion of normal saline as well as abstaining from alcohol and receiving good nutrition.  I have ordered 100 mL per hour of normal saline infusion and put him on CIWA protocol.  I will admit him medically probably await psychiatric evaluation.  ____________________________________________  FINAL CLINICAL IMPRESSION(S) / ED DIAGNOSES  Final diagnoses:  Hyponatremia  Alcohol dependence with unspecified alcohol-induced disorder      NEW MEDICATIONS STARTED DURING THIS VISIT:  New Prescriptions   No medications on file     Loleta Rose, MD 06/26/15 0101  Loleta Rose, MD 06/26/15 0127

## 2015-06-25 NOTE — ED Notes (Signed)
Pt here IVC by [employer]; IVC papers state pt was threatening to hurt himself, pt states "I just want some damn sleep". Pt was seen here yesterday and d/c. Pt denies any SI, HI or hallucinations at this time.

## 2015-06-26 ENCOUNTER — Encounter: Payer: Self-pay | Admitting: Internal Medicine

## 2015-06-26 DIAGNOSIS — E871 Hypo-osmolality and hyponatremia: Secondary | ICD-10-CM | POA: Diagnosis present

## 2015-06-26 DIAGNOSIS — F10129 Alcohol abuse with intoxication, unspecified: Secondary | ICD-10-CM | POA: Diagnosis not present

## 2015-06-26 DIAGNOSIS — F319 Bipolar disorder, unspecified: Secondary | ICD-10-CM

## 2015-06-26 DIAGNOSIS — Y908 Blood alcohol level of 240 mg/100 ml or more: Secondary | ICD-10-CM | POA: Diagnosis present

## 2015-06-26 DIAGNOSIS — Z79899 Other long term (current) drug therapy: Secondary | ICD-10-CM | POA: Diagnosis not present

## 2015-06-26 DIAGNOSIS — Z96641 Presence of right artificial hip joint: Secondary | ICD-10-CM | POA: Diagnosis present

## 2015-06-26 DIAGNOSIS — F1290 Cannabis use, unspecified, uncomplicated: Secondary | ICD-10-CM | POA: Diagnosis present

## 2015-06-26 DIAGNOSIS — F309 Manic episode, unspecified: Secondary | ICD-10-CM | POA: Diagnosis present

## 2015-06-26 DIAGNOSIS — G8929 Other chronic pain: Secondary | ICD-10-CM | POA: Diagnosis present

## 2015-06-26 DIAGNOSIS — F17210 Nicotine dependence, cigarettes, uncomplicated: Secondary | ICD-10-CM | POA: Diagnosis present

## 2015-06-26 DIAGNOSIS — G40909 Epilepsy, unspecified, not intractable, without status epilepticus: Secondary | ICD-10-CM | POA: Diagnosis present

## 2015-06-26 DIAGNOSIS — R569 Unspecified convulsions: Secondary | ICD-10-CM

## 2015-06-26 DIAGNOSIS — I10 Essential (primary) hypertension: Secondary | ICD-10-CM | POA: Diagnosis present

## 2015-06-26 DIAGNOSIS — F10929 Alcohol use, unspecified with intoxication, unspecified: Secondary | ICD-10-CM | POA: Diagnosis present

## 2015-06-26 DIAGNOSIS — F10229 Alcohol dependence with intoxication, unspecified: Secondary | ICD-10-CM | POA: Diagnosis present

## 2015-06-26 DIAGNOSIS — M549 Dorsalgia, unspecified: Secondary | ICD-10-CM | POA: Diagnosis present

## 2015-06-26 DIAGNOSIS — R45851 Suicidal ideations: Secondary | ICD-10-CM | POA: Diagnosis present

## 2015-06-26 LAB — DRUG SCREEN 764883 11+OXYCO+ALC+CRT-BUND
AMPHETAMINES, URINE: NEGATIVE ng/mL
BARBITURATE: NEGATIVE ng/mL
BENZODIAZ UR QL: NEGATIVE ng/mL
CANNABINOID QUANT UR: NEGATIVE ng/mL
COCAINE (METABOLITE): NEGATIVE ng/mL
Creatinine: 10.4 mg/dL — ABNORMAL LOW (ref 20.0–300.0)
METHADONE SCREEN, URINE: NEGATIVE ng/mL
Meperidine: NEGATIVE ng/mL
OPIATE SCREEN URINE: NEGATIVE ng/mL
Oxycodone/Oxymorphone, Urine: NEGATIVE ng/mL
Phencyclidine: NEGATIVE ng/mL
Propoxyphene: NEGATIVE ng/mL
Tramadol: NEGATIVE ng/mL
pH, Urine: 6.5 (ref 4.5–8.9)

## 2015-06-26 LAB — SODIUM
SODIUM: 127 mmol/L — AB (ref 135–145)
SODIUM: 129 mmol/L — AB (ref 135–145)
Sodium: 131 mmol/L — ABNORMAL LOW (ref 135–145)
Sodium: 129 mmol/L — ABNORMAL LOW (ref 135–145)

## 2015-06-26 LAB — MAGNESIUM: MAGNESIUM: 2 mg/dL (ref 1.7–2.4)

## 2015-06-26 LAB — BASIC METABOLIC PANEL
Anion gap: 11 (ref 5–15)
CALCIUM: 8.7 mg/dL — AB (ref 8.9–10.3)
CHLORIDE: 96 mmol/L — AB (ref 101–111)
CO2: 22 mmol/L (ref 22–32)
CREATININE: 0.66 mg/dL (ref 0.61–1.24)
Glucose, Bld: 80 mg/dL (ref 65–99)
Potassium: 4.6 mmol/L (ref 3.5–5.1)
SODIUM: 129 mmol/L — AB (ref 135–145)

## 2015-06-26 LAB — CBC
HCT: 46.8 % (ref 40.0–52.0)
Hemoglobin: 16.4 g/dL (ref 13.0–18.0)
MCH: 36.8 pg — ABNORMAL HIGH (ref 26.0–34.0)
MCHC: 35 g/dL (ref 32.0–36.0)
MCV: 105 fL — AB (ref 80.0–100.0)
PLATELETS: 200 10*3/uL (ref 150–440)
RBC: 4.46 MIL/uL (ref 4.40–5.90)
RDW: 11.9 % (ref 11.5–14.5)
WBC: 5 10*3/uL (ref 3.8–10.6)

## 2015-06-26 LAB — OSMOLALITY: Osmolality: 299 mOsm/kg — ABNORMAL HIGH (ref 275–295)

## 2015-06-26 LAB — OSMOLALITY, URINE: OSMOLALITY UR: 130 mosm/kg — AB (ref 300–900)

## 2015-06-26 LAB — SODIUM, URINE, RANDOM: Sodium, Ur: 12 mmol/L

## 2015-06-26 LAB — PANEL 764016: Ethanol: 0.199 %

## 2015-06-26 LAB — SPECIFIC GRAVITY: Specific Gravity: 1.002

## 2015-06-26 LAB — PHOSPHORUS: PHOSPHORUS: 4.1 mg/dL (ref 2.5–4.6)

## 2015-06-26 MED ORDER — ADULT MULTIVITAMIN W/MINERALS CH
1.0000 | ORAL_TABLET | Freq: Every day | ORAL | Status: DC
  Administered 2015-06-26: 10:00:00 1 via ORAL
  Filled 2015-06-26: qty 1

## 2015-06-26 MED ORDER — PNEUMOCOCCAL VAC POLYVALENT 25 MCG/0.5ML IJ INJ
0.5000 mL | INJECTION | Freq: Once | INTRAMUSCULAR | Status: DC
  Filled 2015-06-26: qty 0.5

## 2015-06-26 MED ORDER — SODIUM CHLORIDE 0.9 % IV SOLN
Freq: Once | INTRAVENOUS | Status: AC
  Administered 2015-06-26: 01:00:00 via INTRAVENOUS

## 2015-06-26 MED ORDER — FOLIC ACID 1 MG PO TABS
1.0000 mg | ORAL_TABLET | Freq: Every day | ORAL | Status: DC
  Administered 2015-06-26: 1 mg via ORAL
  Filled 2015-06-26: qty 1

## 2015-06-26 MED ORDER — THIAMINE HCL 100 MG/ML IJ SOLN: 100.0000 mg | Freq: Every day | INTRAMUSCULAR | Status: DC

## 2015-06-26 MED ORDER — LORAZEPAM 1 MG PO TABS: 0.0000 mg | ORAL_TABLET | Freq: Two times a day (BID) | ORAL | Status: DC

## 2015-06-26 MED ORDER — PNEUMOCOCCAL VAC POLYVALENT 25 MCG/0.5ML IJ INJ: 0.5000 mL | INJECTION | INTRAMUSCULAR | Status: DC

## 2015-06-26 MED ORDER — LORAZEPAM 1 MG PO TABS
0.0000 mg | ORAL_TABLET | Freq: Four times a day (QID) | ORAL | Status: DC
  Administered 2015-06-26 (×2): 1 mg via ORAL
  Filled 2015-06-26 (×2): qty 1

## 2015-06-26 MED ORDER — LORAZEPAM 1 MG PO TABS: 1.0000 mg | ORAL_TABLET | Freq: Four times a day (QID) | ORAL | Status: DC | PRN

## 2015-06-26 MED ORDER — ENOXAPARIN SODIUM 40 MG/0.4ML ~~LOC~~ SOLN: 40.0000 mg | SUBCUTANEOUS | Status: DC

## 2015-06-26 MED ORDER — SODIUM CHLORIDE 0.9 % IV SOLN
INTRAVENOUS | Status: DC
  Administered 2015-06-26 (×2): via INTRAVENOUS

## 2015-06-26 MED ORDER — SODIUM CHLORIDE 0.9 % IJ SOLN
3.0000 mL | Freq: Two times a day (BID) | INTRAMUSCULAR | Status: DC
  Administered 2015-06-26: 03:00:00 3 mL via INTRAVENOUS

## 2015-06-26 MED ORDER — VITAMIN B-1 100 MG PO TABS
100.0000 mg | ORAL_TABLET | Freq: Every day | ORAL | Status: DC
  Administered 2015-06-26: 100 mg via ORAL
  Filled 2015-06-26: qty 1

## 2015-06-26 MED ORDER — LORAZEPAM 2 MG/ML IJ SOLN: 1.0000 mg | Freq: Four times a day (QID) | INTRAMUSCULAR | Status: DC | PRN

## 2015-06-26 MED ORDER — OLANZAPINE 10 MG PO TABS: 10.0000 mg | ORAL_TABLET | Freq: Every day | ORAL | Status: DC

## 2015-06-26 NOTE — Plan of Care (Signed)
Problem: Discharge Progression Outcomes Goal: Discharge plan in place and appropriate Individualization:  Outcome: Progressing Pt prefers to be called Jacob Velez who currently is staying at a motel but states he lives in a his w/ roommates Hx ETOH abuse, HTN, Bipolar 1 Disorder controlled by medications High fall risk. IVC sitter at bedside Goal: Other Discharge Outcomes/Goals Outcome: Progressing Plan of care progress to goals: 1. No c/o pain 2. Hemodynamically:             -VSS, afebrile             -IVF infusing as ordered              -Na increased to 127, will be rechecked Q4h 3. Does not express suicidal ideation  4. Tolerating diet well, good appetite  5. High fall risk, unsteady gait currently. IVC sitter at bedside

## 2015-06-26 NOTE — Progress Notes (Signed)
   06/26/15 7829  Clinical Encounter Type  Visited With Patient  Visit Type Initial  Referral From Nurse  Consult/Referral To Chaplain  Chaplain responded to an order to speak with patient but patient wouldn't respond to visit and kept dosing off to sleep.   Chaplain Brianna Headen Ext. [phone]

## 2015-06-26 NOTE — Discharge Instructions (Signed)
Please refrain from drinking alcohol. Please follow through with treatment recommendations from Dr Toni Amend.   DIET:  Regular diet  DISCHARGE CONDITION:  Fair  ACTIVITY:  Activity as tolerated  OXYGEN:  Home Oxygen: No.   Oxygen Delivery: room air  DISCHARGE LOCATION:  home   If you experience worsening of your admission symptoms, develop shortness of breath, life threatening emergency, suicidal or homicidal thoughts you must seek medical attention immediately by calling 911 or calling your MD immediately  if symptoms less severe.  You Must read complete instructions/literature along with all the possible adverse reactions/side effects for all the Medicines you take and that have been prescribed to you. Take any new Medicines after you have completely understood and accpet all the possible adverse reactions/side effects.   Please note  You were cared for by a hospitalist during your hospital stay. If you have any questions about your discharge medications or the care you received while you were in the hospital after you are discharged, you can call the unit and asked to speak with the hospitalist on call if the hospitalist that took care of you is not available. Once you are discharged, your primary care physician will handle any further medical issues. Please note that NO REFILLS for any discharge medications will be authorized once you are discharged, as it is imperative that you return to your primary care physician (or establish a relationship with a primary care physician if you do not have one) for your aftercare needs so that they can reassess your need for medications and monitor your lab values.

## 2015-06-26 NOTE — Plan of Care (Signed)
Pt seen by Dr. Toni Amend - IVC cancelled.  Dr. Clent Ridges d/cing pt.  IV removed.  Taxi voucher obtained from CSW.

## 2015-06-26 NOTE — BH Assessment (Signed)
Assessment Note  Jacob Velez is an 54 y.o. male. Jacob Velez reports to the ED with his mother.  He states his mother called the police because she is worried about him.  He states his mother told police that he has been threatening to kill himself multiple times.  He reports to the TTS that he girlfriend was worried about him and that he really needed some sleep.  He provided a rambling story to the TTS that was inconsistent and nonsensicle.  He states that he is tired, but he could not sleep in the hospital. He states he has no reason to be here, and to give his room to someone else. He denied being depressed or anxious.  He denied suicidal or homicidal ideation or intent.  He denied auditory or visual hallucinations.  His speech appeared pressured and tangential.  He was unable to maintain a flow of thoughts.  Axis I: Mood Disorder NOS Axis II: Deferred Axis III:  Past Medical History  Diagnosis Date  . Hypertension   . Seizures 2004    due to dehydration, low sodium   . H/O staphylococcal infection Childhood    right leg. Chigger bite and scratched "with dirty nails"  . Alcohol abuse   . Bipolar 1 disorder    Axis IV: problems with primary support group Axis V: 41-50 serious symptoms  Past Medical History:  Past Medical History  Diagnosis Date  . Hypertension   . Seizures 2004    due to dehydration, low sodium   . H/O staphylococcal infection Childhood    right leg. Chigger bite and scratched "with dirty nails"  . Alcohol abuse   . Bipolar 1 disorder     Past Surgical History  Procedure Laterality Date  . Joint replacement Right 2012    right hip replacement    Family History:  Family History  Problem Relation Age of Onset  . Seizures Mother   . Stroke Father 39    2/2 chemicals exposed to during [place]. He was a Occupational hygienist  . Cancer Neg Hx     Social History:  reports that he has been smoking.  He has never used smokeless tobacco. He reports that he drinks alcohol.  He reports that he does not use illicit drugs.  Additional Social History:  Alcohol / Drug Use History of alcohol / drug use?:  (Denied use of drugs and alcohol)  CIWA: CIWA-Ar BP: (!) 152/88 mmHg Pulse Rate: 69 Nausea and Vomiting: no nausea and no vomiting Tactile Disturbances: none Tremor: not visible, but can be felt fingertip to fingertip Auditory Disturbances: not present Paroxysmal Sweats: barely perceptible sweating, palms moist Visual Disturbances: not present Anxiety: mildly anxious Headache, Fullness in Head: very mild Agitation: two Orientation and Clouding of Sensorium: oriented and can do serial additions CIWA-Ar Total: 6 COWS:    Allergies: No Known Allergies  Home Medications:  (Not in a hospital admission)  OB/GYN Status:  No LMP for male patient.  General Assessment Data Location of Assessment: [facility] ED TTS Assessment: In system Is this a Tele or Face-to-Face Assessment?: Face-to-Face Is this an Initial Assessment or a Re-assessment for this encounter?: Initial Assessment Marital status: Single Maiden name: n/a Is patient pregnant?: No Pregnancy Status: No Living Arrangements: Non-relatives/Friends Can pt return to current living arrangement?: Yes Admission Status: Involuntary Is patient capable of signing voluntary admission?: Yes Referral Source: Other Insurance type: [employer] Exam [facility] Walk-in ONLY) Medical Exam completed: Yes  Crisis Care Plan Living  Arrangements: Non-relatives/Friends Name of Psychiatrist: None Name of Therapist: None  Education Status Is patient currently in school?: No Current Grade: n/a Highest grade of school patient has completed: n/a Name of school: n/a Contact person: n/a  Risk to self with the past 6 months Suicidal Ideation: No (Denied) Has patient been a risk to self within the past 6 months prior to admission? : No Suicidal Intent: No Has patient had any suicidal intent within the past  6 months prior to admission? : No Is patient at risk for suicide?: No Suicidal Plan?: No Has patient had any suicidal plan within the past 6 months prior to admission? : No Access to Means: No What has been your use of drugs/alcohol within the last 12 months?: n/a Previous Attempts/Gestures: No How many times?: 0 Other Self Harm Risks: None reported Triggers for Past Attempts: None known Intentional Self Injurious Behavior: None Family Suicide History: No Recent stressful life event(s):  (None reported) Persecutory voices/beliefs?: No Depression: No Depression Symptoms:  (None reported) Substance abuse history and/or treatment for substance abuse?: Yes Suicide prevention information given to non-admitted patients: Not applicable  Risk to Others within the past 6 months Homicidal Ideation: No Does patient have any lifetime risk of violence toward others beyond the six months prior to admission? : Unknown Thoughts of Harm to Others: No Current Homicidal Intent: No Current Homicidal Plan: No Access to Homicidal Means: No Identified Victim: None reported History of harm to others?: No Assessment of Violence: None Noted Violent Behavior Description: None Does patient have access to weapons?: No Criminal Charges Pending?: No Does patient have a court date: No Is patient on probation?: No  Psychosis Hallucinations: None noted Delusions: None noted  Mental Status Report Appearance/Hygiene: In scrubs Eye Contact: Fair Motor Activity: Restlessness Speech: Tangential, Rapid, Pressured Level of Consciousness: Alert Mood: Anxious Affect: Irritable Anxiety Level: Moderate Judgement: Partial Orientation: Person, Place, Situation, Time Obsessive Compulsive Thoughts/Behaviors: None  Cognitive Functioning Concentration: Decreased Memory: Recent Intact IQ: Average Insight: Fair Impulse Control: Poor Appetite: Fair Sleep: Decreased Vegetative Symptoms: None  ADLScreening  [facility] Assessment Services) Patient's cognitive ability adequate to safely complete daily activities?: Yes Patient able to express need for assistance with ADLs?: Yes Independently performs ADLs?: Yes (appropriate for developmental age)  Prior Inpatient Therapy Prior Inpatient Therapy: No  Prior Outpatient Therapy Prior Outpatient Therapy: No Does patient have an ACCT team?: No Does patient have Intensive In-House Services?  : No Does patient have [facility] services? : No Does patient have P4CC services?: No  ADL Screening (condition at time of admission) Patient's cognitive ability adequate to safely complete daily activities?: Yes Patient able to express need for assistance with ADLs?: Yes Independently performs ADLs?: Yes (appropriate for developmental age)       Abuse/Neglect Assessment (Assessment to be complete while patient is alone) Physical Abuse: Denies Verbal Abuse: Denies Sexual Abuse: Denies Exploitation of patient/patient's resources: Denies Self-Neglect: Denies Values / Beliefs Cultural Requests During Hospitalization: None Spiritual Requests During Hospitalization: None   Advance Directives (For Healthcare) Does patient have an advance directive?: No Would patient like information on creating an advanced directive?: No - patient declined information    Additional Information 1:1 In Past 12 Months?: No CIRT Risk: No Elopement Risk: No Does patient have medical clearance?: No     Disposition:  Disposition Initial Assessment Completed for this Encounter: Yes Disposition of Patient: Other dispositions (Admitted Medically)  On Site Evaluation by:   Reviewed with Physician:    Justice Deeds  06/26/2015 1:20 AM

## 2015-06-26 NOTE — Progress Notes (Signed)
[facility] - [place] at [facility]   PATIENT NAME: Jacob Velez    MR#:  [id number]  DATE OF BIRTH:  1961/04/04  SUBJECTIVE:  CHIEF COMPLAINT:   Chief Complaint  Patient presents with  . Behavior Problem   Very talkative. States that he feels great. Denies suicidal ideation. Denies anxiety.  REVIEW OF SYSTEMS:   Review of Systems  Constitutional: Negative for fever.  Respiratory: Negative for shortness of breath.   Cardiovascular: Negative for chest pain and palpitations.  Gastrointestinal: Negative for nausea, vomiting and abdominal pain.  Genitourinary: Negative for dysuria.    DRUG ALLERGIES:  No Known Allergies  VITALS:  Blood pressure 122/73, pulse 71, temperature 97.5 F (36.4 C), temperature source Oral, resp. rate 18, height  (1.651 m), weight 55.52 kg (122 lb 6.4 oz), SpO2 100 %.  PHYSICAL EXAMINATION:  GENERAL:  54 y.o.-year-old patient sitting up, no distress, thin EYES: Pupils equal, round, reactive to light and accommodation. No scleral icterus. Extraocular muscles intact.  HEENT: Head atraumatic, normocephalic. Oropharynx and nasopharynx clear.  NECK:  Supple, no jugular venous distention. No thyroid enlargement, no tenderness.  LUNGS: Normal breath sounds bilaterally, no wheezing, rales,rhonchi or crepitation. No use of accessory muscles of respiration.  CARDIOVASCULAR: S1, S2 normal. No murmurs, rubs, or gallops.  ABDOMEN: Soft, nontender, nondistended. Bowel sounds present. No organomegaly or mass.  EXTREMITIES: No pedal edema, cyanosis, or clubbing.  NEUROLOGIC: Cranial nerves II through XII are intact. Muscle strength 5/5 in all extremities. Sensation intact. Gait not checked.  PSYCHIATRIC: The patient is alert and oriented x 3. Very talkative. Possibly delusional SKIN: No obvious rash, lesion, or ulcer.    LABORATORY PANEL:   CBC  Recent Labs Lab 06/26/15 0247  WBC 5.0  HGB 16.4  HCT 46.8  PLT 200    ------------------------------------------------------------------------------------------------------------------  Chemistries   Recent Labs Lab 06/25/15 1714 06/26/15 0247  06/26/15 1039  NA 120* 129*  127*  < > 129*  K 4.1 4.6  --   --   CL 84* 96*  --   --   CO2 24 22  --   --   GLUCOSE 86 80  --   --   BUN <5* <5*  --   --   CREATININE 0.62 0.66  --   --   CALCIUM 8.7* 8.7*  --   --   MG  --  2.0  --   --   AST 69*  --   --   --   ALT 28  --   --   --   ALKPHOS 55  --   --   --   BILITOT 0.4  --   --   --   < > = values in this interval not displayed. ------------------------------------------------------------------------------------------------------------------  Cardiac Enzymes No results for input(s): TROPONINI in the last 168 hours. ------------------------------------------------------------------------------------------------------------------  RADIOLOGY:  No results found.  EKG:   Orders placed or performed during the hospital encounter of 06/25/15  . ED EKG  . ED EKG  . EKG 12-Lead  . EKG 12-Lead    ASSESSMENT AND PLAN:   #1 suicidal ideation: Patient denies suicidal ideation. States that this story was made up by his roommate's girlfriend. He does have a history of bipolar disorder and seems manic at this time. Psychiatry to see today.  #2 hyponatremia: initial sodium 120, with IV fluids to 129. His baseline seems to be at 130. 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  #3 alcohol abuse: On presentation had elevated alcohol level at 242. Not currently showing signs of withdrawal other than possible anxiety. Blood pressure heart rate normal. No tremors or diaphoresis. He does have a history of DTs. He denies excessive alcohol consumption in the recent past. He has had 2 mg of Ativan today so far.  #4 hypertension: Blood pressure well controlled at this time no antihypertensives  #5 history of seizure disorder:  Not on any chronic antiepileptics. No seizure episodes at this time.  At this time the patient is medically stable and ready for discharge. The question remains whether he is appropriate for inpatient behavioral health. Dr. Toni Amend will see the patient this afternoon to decide.  CODE STATUS: Full  TOTAL TIME TAKING CARE OF THIS PATIENT: 35 minutes.  Greater than 50% of time spent in care coordination and counseling. POSSIBLE D/C IN ? DAYS, DEPENDING ON CLINICAL CONDITION.   Elby Showers M.D on 06/26/2015 at 1:52 PM  Between 7am to 6pm - Pager - [phone]  After 6pm go to [email or web address] - password [email or web address] [facility]  [facility]  Office  [phone]  CC: Primary care physician; Novella Olive, NP

## 2015-06-26 NOTE — H&P (Signed)
[facility] - [place] at [facility]   PATIENT NAME: Jacob Velez    MR#:  [id number]  DATE OF BIRTH:  20-Dec-1960  DATE OF ADMISSION:  06/25/2015  PRIMARY CARE PHYSICIAN: Novella Olive, NP   REQUESTING/REFERRING PHYSICIAN: York Cerise, M.D.  CHIEF COMPLAINT:   Chief Complaint  Patient presents with  . Behavior Problem    HISTORY OF PRESENT ILLNESS:  Jacob Velez  is a 54 y.o. male who presents with suicidal ideation and alcohol intoxication. Patient gives a very tangential and unclear history. Collateral history taken from the ED physician who also spoke with police, who brought the patient in to the ED initially. Patient has been IVC for suicidal ideation. However, on evaluation in the ED his alcohol level was significantly elevated, and his sodium was found to be 120. Patient does have a history of seizures in the past. Hospitalists were called for admission for significant hyponatremia and alcohol intoxication requiring monitoring during withdrawal. Patient denies any symptoms.  PAST MEDICAL HISTORY:   Past Medical History  Diagnosis Date  . Hypertension   . Seizures 2004    due to dehydration, low sodium   . H/O staphylococcal infection Childhood    right leg. Chigger bite and scratched "with dirty nails"  . Alcohol abuse   . Bipolar 1 disorder     PAST SURGICAL HISTORY:   Past Surgical History  Procedure Laterality Date  . Joint replacement Right 2012    right hip replacement    SOCIAL HISTORY:   Social History  Substance Use Topics  . Smoking status: Current Every Day Smoker -- 0.50 packs/day for 20 years  . Smokeless tobacco: Never Used  . Alcohol Use: 0.0 oz/week    5-7 Cans of beer per week    FAMILY HISTORY:   Family History  Problem Relation Age of Onset  . Seizures Mother   . 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 He was a Occupational hygienist  . Cancer Neg Hx     DRUG ALLERGIES:  No Known  Allergies  MEDICATIONS AT HOME:   Prior to Admission medications   Medication Sig Start Date End Date Taking? Authorizing Provider  atenolol (TENORMIN) 100 MG tablet TAKE 1 TABLET (100 MG TOTAL) BY MOUTH 2 (TWO) TIMES DAILY. 06/03/15  Yes Sherlene Shams, MD  chlordiazePOXIDE (LIBRIUM) 25 MG capsule 1-2 capsules every 8 hours for 48 hours 1 capsule every 8 hours for 48 hours  1 capsule every 12 hours for 48 hours 1 capsule daily thereafter 06/21/15  Yes Sherlene Shams, MD  varenicline (CHANTIX CONTINUING MONTH PAK) 1 MG tablet Take 1 tablet (1 mg total) by mouth 2 (two) times daily. Patient not taking: Reported on 06/21/2015 07/03/14   Sherlene Shams, MD  varenicline (CHANTIX STARTING MONTH PAK) 0.5 MG X 11 & 1 MG X 42 tablet Take one 0.5 mg tablet by mouth once daily for 3 days, then increase to one 0.5 mg tablet twice daily for 4 days, then increase to one 1 mg tablet twice daily. Patient not taking: Reported on 06/21/2015 07/03/14   Sherlene Shams, MD    REVIEW OF SYSTEMS:  Review of Systems  Constitutional: Negative for fever, chills, weight loss and malaise/fatigue.  HENT: Negative for ear pain, hearing loss and tinnitus.   Eyes: Negative for blurred vision, double vision, pain and redness.  Respiratory: Negative for cough, hemoptysis and shortness of breath.   Cardiovascular: Negative for chest  pain, palpitations, orthopnea and leg swelling.  Gastrointestinal: Negative for nausea, vomiting, abdominal pain, diarrhea and constipation.  Genitourinary: Negative for dysuria, frequency and hematuria.  Musculoskeletal: Negative for back pain, joint pain and neck pain.  Skin:       No acne, rash, or lesions  Neurological: Negative for dizziness, tremors, focal weakness and weakness.  Endo/Heme/Allergies: Negative for polydipsia. Does not bruise/bleed easily.  Psychiatric/Behavioral: Negative for depression. The patient is not nervous/anxious and does not have insomnia.      VITAL SIGNS:    Filed Vitals:   06/25/15 1732 06/25/15 2233  BP: 152/88 152/88  Pulse: 69 69  TempSrc: Oral   Resp: 16   Height:  (1.651 m)   Weight: 56.246 kg (124 lb)   SpO2: 99%    Wt Readings from Last 3 Encounters:  06/25/15 56.246 kg (124 lb)  06/24/15 56.246 kg (124 lb)  06/21/15 56.019 kg (123 lb 8 oz)    PHYSICAL EXAMINATION:  Physical Exam  Vitals reviewed. Constitutional: He is oriented to person, place, and time. He appears well-developed and well-nourished. No distress.  HENT:  Head: Normocephalic and atraumatic.  Mouth/Throat: Oropharynx is clear and moist.  Eyes: Conjunctivae and EOM are normal. Pupils are equal, round, and reactive to light. No scleral icterus.  Neck: Normal range of motion. Neck supple. No JVD present. No thyromegaly present.  Cardiovascular: Normal rate, regular rhythm and intact distal pulses.  Exam reveals no gallop and no friction rub.   No murmur heard. Respiratory: Effort normal and breath sounds normal. No respiratory distress. He has no wheezes. He has no rales.  GI: Soft. Bowel sounds are normal. He exhibits no distension. There is no tenderness.  Musculoskeletal: Normal range of motion. He exhibits no edema.  No arthritis, no gout  Lymphadenopathy:    He has no cervical adenopathy.  Neurological: He is alert and oriented to person, place, and time. No cranial nerve deficit.  No dysarthria, no aphasia  Skin: Skin is warm and dry. No rash noted. No erythema.  Psychiatric:  Accelerated mood, normal affect, tangential thinking.    LABORATORY PANEL:   CBC  Recent Labs Lab 06/25/15 1714  WBC 5.2  HGB 16.8  HCT 47.6  PLT 263   ------------------------------------------------------------------------------------------------------------------  Chemistries   Recent Labs Lab 06/25/15 1714  NA 120*  K 4.1  CL 84*  CO2 24  GLUCOSE 86  BUN <5*  CREATININE 0.62  CALCIUM 8.7*  AST 69*  ALT 28  ALKPHOS 55  BILITOT 0.4    ------------------------------------------------------------------------------------------------------------------  Cardiac Enzymes No results for input(s): TROPONINI in the last 168 hours. ------------------------------------------------------------------------------------------------------------------  RADIOLOGY:  No results found.  EKG:   Orders placed or performed during the hospital encounter of 06/25/15  . ED EKG  . ED EKG    IMPRESSION AND PLAN:  Principal Problem:   Suicidal ideation - sitter at bedside, psychiatry consult placed. Patient denies suicidal ideation to this interviewer, however per chart review his story has changed several times depending on who he is talking to. Defer to psychiatry's recommendations for treatment of this Active Problems:   Hyponatremia - sodium of 120. On chart review his sodium has been low for some time now, though typically in the high 120s to low 130s. On 9/16 it was 122. Hence we do not want to raise it to quickly as it has been low for several days. IV fluids initiated in the ED, we will continues on admission and get every 4  sodium draws to raise his sodium by rate of 10-12/24 hours. However, he will likely only come back up to what seems to be his baseline in the high 120s or low 130s.   Alcohol intoxication - elevated alcohol level. CIWA protocol initiated   Essential hypertension, benign - close monitoring of blood pressure while here. We will initiate antihypertensives if patient's blood pressure is significantly elevated   Seizures - not on any antiepileptics at home. Seizure precautions while here  All the records are reviewed and case discussed with ED provider. Management plans discussed with the patient and/or family.  DVT PROPHYLAXIS: SubQ lovenox  ADMISSION STATUS: Inpatient  CODE STATUS: Full  TOTAL TIME TAKING CARE OF THIS PATIENT: 40 minutes.    WILLIS, DAVID FIELDING 06/26/2015, 12:46 AM  [facility]  [facility]  Office  [phone]  CC: Primary care physician; Novella Olive, NP

## 2015-06-26 NOTE — Progress Notes (Signed)
   06/26/15 1015  Clinical Encounter Type  Visited With Patient  Visit Type Initial;Follow-up  Referral From Nurse  Consult/Referral To Chaplain  Provided pastoral support and encouragement to patient and sitter in room.  [employer] [place]-pager [phone]

## 2015-06-26 NOTE — ED Notes (Signed)
BEHAVIORAL HEALTH ROUNDING Patient sleeping: No. Patient alert and oriented: yes Behavior appropriate: Yes.  ; If no, describe:  Nutrition and fluids offered: Yes  Toileting and hygiene offered: Yes  Sitter present: no Law enforcement present: Yes  

## 2015-06-26 NOTE — Consult Note (Signed)
[facility] Face-to-Face Psychiatry Consult   Reason for Consult:  Consult for this 54 year old man with a history of alcohol abuse and bipolar disorder. He is under commitment claiming that he had made suicidal like statements Referring Physician:  Volanda Napoleon Patient Identification: Jacob Velez MRN:  [id number] Principal Diagnosis: Alcohol intoxication Diagnosis:   Patient Active Problem List   Diagnosis Date Noted  . Alcohol intoxication [F10.129] 06/26/2015  . Suicidal ideation [R45.851] 06/26/2015  . Seizures [R56.9] 06/26/2015  . Bipolar disorder [F31.9] 06/26/2015  . Alcohol abuse [F10.10] 06/21/2015  . Macrocytosis without anemia [D75.89] 07/04/2014  . Essential hypertension, benign [I10] 07/03/2014  . Hyponatremia [E87.1] 07/03/2014  . Routine general medical examination at a health care facility [Z00.00] 04/27/2013  . Tobacco use disorder [Z72.0] 04/27/2013  . Tobacco abuse counseling [Z71.6] 04/27/2013    Total Time spent with patient: 1 hour  Subjective:   Jacob Velez is a 54 y.o. male patient admitted with "my girlfriend just wanted to do this to me"  .  HPI:  Information from the patient and the chart. Patient also known to me from previous admissions. The patient was brought in under involuntary commitment filed by his girlfriend. She claimed that he had made some statements that had a suicidal tone to them last night while he was intoxicated. When patient came to the emergency room he had a blood alcohol level of 199. He was thought to be in problematic alcohol withdrawal and also was hyponatremic. For this reason he was admitted to the medicine service. Patient states that his mood recently has been "fantastic". He says that he sleeps fine about 6 hours a night. Denies that he's having any suicidal or homicidal ideation. He says that his girlfriend just said that to get even with him. He admits that he was drinking yesterday but claims he had only 2 beers. He says he drinks  about 3 or 4 times a week usually not too much. Admits that he uses marijuana fairly regularly but denies that he's been abusing cocaine. He has not recently been getting any outpatient psychiatric treatment. It's been a few years since he was seeing anyone for treatment of his history of bipolar disorder. Patient denies any major stresses. Says that he enjoys his job working at a [employer].  Past psychiatric history: History of bipolar disorder. I have seen him at times be significantly more manic and disorganized in his thinking than he is now. I don't believe that he has a history of suicide attempts although he does have a history of suicidal statements and of agitation. He had been treated in the past for bipolar disorder but has not been noncompliant for years. Has not been hospitalized for many years.  Medical history: Recent hyponatremia. History of seizures related to alcohol withdrawal. Tobacco abuse.  Social history: Patient lives with his girlfriend. 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 He says he makes reasonably good money and feels good about his life.  Family history: Denies any family history of mental illness  Current medication: He sees Dr. too low and says that she had recently been prescribing Librium for him. Takes atenolol presumably for blood pressure. HPI Elements:   Quality:  Alleged suicidal statements and intoxication. Severity:  Moderate to severe. Timing:  Intermittent but seems to of escalated yesterday. Duration:  Seems to be resolving at this point as he is no longer intoxicated. Context:  Chronic alcohol abuse.  Past Medical History:  Past Medical History  Diagnosis Date  . Hypertension   . Seizures 2004    due to dehydration, low sodium   . H/O staphylococcal infection Childhood    right leg. Chigger bite and scratched "with dirty nails"  . Alcohol abuse   . Bipolar 1 disorder     Past Surgical History  Procedure Laterality Date  .  Joint replacement Right 2012    right hip replacement   Family History:  Family History  Problem Relation Age of Onset  . Seizures Mother   . Stroke Father 46    2/2 chemicals exposed to during [place]. He was a Insurance underwriter  . Cancer Neg Hx    Social History:  History  Alcohol Use  . 0.0 oz/week  . 5-7 Cans of beer per week     History  Drug Use No    Social History   Social History  . Marital Status: Single    Spouse Name: N/A  . Number of Children: 2  . Years of Education: 16   Occupational History  . [employer] History Main Topics  . Smoking status: Current Every Day Smoker -- 0.50 packs/day for 20 years  . Smokeless tobacco: Never Used  . Alcohol Use: 0.0 oz/week    5-7 Cans of beer per week  . Drug Use: No  . Sexual Activity: Not Asked   Other Topics Concern  . None   Social History Narrative   Patient was born in [place]. His father was a Nature conservation officer man. They later moved to [facility] and was there until age 62. His father was then transferred to [place], [place] and later to [place], [place] to be the area [employer]. Patient attended [employer] and obtained his Bachelors in Forensic psychologist. He recently moved back to the area after living years in [place]. He is divorced and has a son and daughter. He lives at home by himself. He moved to the area to be closer to his elderly parents. Both of his children are away in college. One is at [employer] and the other at [employer]. Patient enjoys playing golf, going to baseball games, fishing, etc. He enjoys the outdoors.   Additional Social History:  Specify valuables returned: cell phone, wallet, clothes  History of alcohol / drug use?:  (Denied use of drugs and alcohol)                     Allergies:  No Known Allergies  Labs:  Results for orders placed or performed during the hospital encounter of 06/25/15 (from the past 48 hour(s))  Comprehensive metabolic panel     Status: Abnormal    Collection Time: 06/25/15  5:14 PM  Result Value Ref Range   Sodium 120 (L) 135 - 145 mmol/L   Potassium 4.1 3.5 - 5.1 mmol/L   Chloride 84 (L) 101 - 111 mmol/L   CO2 24 22 - 32 mmol/L   Glucose, Bld 86 65 - 99 mg/dL   BUN <5 (L) 6 - 20 mg/dL   Creatinine, Ser 0.62 0.61 - 1.24 mg/dL   Calcium 8.7 (L) 8.9 - 10.3 mg/dL   Total Protein 7.8 6.5 - 8.1 g/dL   Albumin 4.7 3.5 - 5.0 g/dL   AST 69 (H) 15 - 41 U/L   ALT 28 17 - 63 U/L   Alkaline Phosphatase 55 38 - 126 U/L   Total Bilirubin 0.4 0.3 - 1.2 mg/dL   GFR calc non Af Amer >  60 >60 mL/min   GFR calc Af Amer >60 >60 mL/min    Comment: (NOTE) The eGFR has been calculated using the CKD EPI equation. This calculation has not been validated in all clinical situations. eGFR's persistently <60 mL/min signify possible Chronic Kidney Disease.    Anion gap 12 5 - 15  Ethanol (ETOH)     Status: Abnormal   Collection Time: 06/25/15  5:14 PM  Result Value Ref Range   Alcohol, Ethyl (B) 242 (H) <5 mg/dL    Comment:        LOWEST DETECTABLE LIMIT FOR SERUM ALCOHOL IS 5 mg/dL FOR MEDICAL PURPOSES ONLY   Salicylate level     Status: None   Collection Time: 06/25/15  5:14 PM  Result Value Ref Range   Salicylate Lvl <3.7 2.8 - 30.0 mg/dL  Acetaminophen level     Status: Abnormal   Collection Time: 06/25/15  5:14 PM  Result Value Ref Range   Acetaminophen (Tylenol), Serum <10 (L) 10 - 30 ug/mL    Comment:        THERAPEUTIC CONCENTRATIONS VARY SIGNIFICANTLY. A RANGE OF 10-30 ug/mL MAY BE AN EFFECTIVE CONCENTRATION FOR MANY PATIENTS. HOWEVER, SOME ARE BEST TREATED AT CONCENTRATIONS OUTSIDE THIS RANGE. ACETAMINOPHEN CONCENTRATIONS >150 ug/mL AT 4 HOURS AFTER INGESTION AND >50 ug/mL AT 12 HOURS AFTER INGESTION ARE OFTEN ASSOCIATED WITH TOXIC REACTIONS.   CBC     Status: Abnormal   Collection Time: 06/25/15  5:14 PM  Result Value Ref Range   WBC 5.2 3.8 - 10.6 K/uL   RBC 4.54 4.40 - 5.90 MIL/uL   Hemoglobin 16.8 13.0 - 18.0 g/dL    HCT 47.6 40.0 - 52.0 %   MCV 104.8 (H) 80.0 - 100.0 fL   MCH 37.0 (H) 26.0 - 34.0 pg   MCHC 35.3 32.0 - 36.0 g/dL   RDW 11.7 11.5 - 14.5 %   Platelets 263 150 - 440 K/uL  Urine Drug Screen, Qualitative ([facility] only)     Status: Abnormal   Collection Time: 06/25/15  5:14 PM  Result Value Ref Range   Tricyclic, Ur Screen NONE DETECTED NONE DETECTED   Amphetamines, Ur Screen NONE DETECTED NONE DETECTED   MDMA (Ecstasy)Ur Screen NONE DETECTED NONE DETECTED   Cocaine Metabolite,Ur [place] NONE DETECTED NONE DETECTED   Opiate, Ur Screen NONE DETECTED NONE DETECTED   Phencyclidine (PCP) Ur S NONE DETECTED NONE DETECTED   Cannabinoid 50 Ng, Ur [place] POSITIVE (A) NONE DETECTED   Barbiturates, Ur Screen NONE DETECTED NONE DETECTED   Benzodiazepine, Ur Scrn NONE DETECTED NONE DETECTED   Methadone Scn, Ur NONE DETECTED NONE DETECTED    Comment: (NOTE) 342  Tricyclics, urine               Cutoff 1000 ng/mL 200  Amphetamines, urine             Cutoff 1000 ng/mL 300  MDMA (Ecstasy), urine           Cutoff 500 ng/mL 400  Cocaine Metabolite, urine       Cutoff 300 ng/mL 500  Opiate, urine                   Cutoff 300 ng/mL 600  Phencyclidine (PCP), urine      Cutoff 25 ng/mL 700  Cannabinoid, urine              Cutoff 50 ng/mL 800  Barbiturates, urine  Cutoff 200 ng/mL 900  Benzodiazepine, urine           Cutoff 200 ng/mL 1000 Methadone, urine                Cutoff 300 ng/mL 1100 1200 The urine drug screen provides only a preliminary, unconfirmed 1300 analytical test result and should not be used for non-medical 1400 purposes. Clinical consideration and professional judgment should 1500 be applied to any positive drug screen result due to possible 1600 interfering substances. A more specific alternate chemical method 1700 must be used in order to obtain a confirmed analytical result.  1800 Gas chromato graphy / mass spectrometry (GC/MS) is the preferred 1900 confirmatory method.    Osmolality     Status: Abnormal   Collection Time: 06/25/15  5:44 PM  Result Value Ref Range   Osmolality 299 (H) 275 - 295 mOsm/kg  Osmolality, urine     Status: Abnormal   Collection Time: 06/25/15  5:51 PM  Result Value Ref Range   Osmolality, Ur 130 (L) 300 - 900 mOsm/kg  Sodium, urine, random     Status: None   Collection Time: 06/25/15  5:51 PM  Result Value Ref Range   Sodium, Ur 12 mmol/L  Magnesium     Status: None   Collection Time: 06/26/15  2:47 AM  Result Value Ref Range   Magnesium 2.0 1.7 - 2.4 mg/dL  Phosphorus     Status: None   Collection Time: 06/26/15  2:47 AM  Result Value Ref Range   Phosphorus 4.1 2.5 - 4.6 mg/dL  Basic metabolic panel     Status: Abnormal   Collection Time: 06/26/15  2:47 AM  Result Value Ref Range   Sodium 129 (L) 135 - 145 mmol/L   Potassium 4.6 3.5 - 5.1 mmol/L   Chloride 96 (L) 101 - 111 mmol/L   CO2 22 22 - 32 mmol/L   Glucose, Bld 80 65 - 99 mg/dL   BUN <5 (L) 6 - 20 mg/dL   Creatinine, Ser 0.66 0.61 - 1.24 mg/dL   Calcium 8.7 (L) 8.9 - 10.3 mg/dL   GFR calc non Af Amer >60 >60 mL/min   GFR calc Af Amer >60 >60 mL/min    Comment: (NOTE) The eGFR has been calculated using the CKD EPI equation. This calculation has not been validated in all clinical situations. eGFR's persistently <60 mL/min signify possible Chronic Kidney Disease.    Anion gap 11 5 - 15  CBC     Status: Abnormal   Collection Time: 06/26/15  2:47 AM  Result Value Ref Range   WBC 5.0 3.8 - 10.6 K/uL   RBC 4.46 4.40 - 5.90 MIL/uL   Hemoglobin 16.4 13.0 - 18.0 g/dL   HCT 46.8 40.0 - 52.0 %   MCV 105.0 (H) 80.0 - 100.0 fL   MCH 36.8 (H) 26.0 - 34.0 pg   MCHC 35.0 32.0 - 36.0 g/dL   RDW 11.9 11.5 - 14.5 %   Platelets 200 150 - 440 K/uL  Sodium     Status: Abnormal   Collection Time: 06/26/15  2:47 AM  Result Value Ref Range   Sodium 127 (L) 135 - 145 mmol/L  Sodium     Status: Abnormal   Collection Time: 06/26/15  6:27 AM  Result Value Ref Range    Sodium 131 (L) 135 - 145 mmol/L  Sodium     Status: Abnormal   Collection Time: 06/26/15 10:39 AM  Result Value Ref  Range   Sodium 129 (L) 135 - 145 mmol/L  Sodium     Status: Abnormal   Collection Time: 06/26/15  2:19 PM  Result Value Ref Range   Sodium 129 (L) 135 - 145 mmol/L    Vitals: Blood pressure 126/85, pulse 83, temperature 97.5 F (36.4 C), temperature source Oral, resp. rate 18, height 5' 5"  (1.651 m), weight 55.52 kg (122 lb 6.4 oz), SpO2 100 %.  Risk to Self: Suicidal Ideation: No (Denied) Suicidal Intent: No Is patient at risk for suicide?: No Suicidal Plan?: No Access to Means: No What has been your use of drugs/alcohol within the last 12 months?: n/a How many times?: 0 Other Self Harm Risks: None reported Triggers for Past Attempts: None known Intentional Self Injurious Behavior: None Risk to Others: Homicidal Ideation: No Thoughts of Harm to Others: No Current Homicidal Intent: No Current Homicidal Plan: No Access to Homicidal Means: No Identified Victim: None reported History of harm to others?: No Assessment of Violence: None Noted Violent Behavior Description: None Does patient have access to weapons?: No Criminal Charges Pending?: No Does patient have a court date: No Prior Inpatient Therapy: Prior Inpatient Therapy: No Prior Outpatient Therapy: Prior Outpatient Therapy: No Does patient have an ACCT team?: No Does patient have Intensive In-House Services?  : No Does patient have [facility] services? : No Does patient have P4CC services?: No  Current Facility-Administered Medications  Medication Dose Route Frequency Provider Last Rate Last Dose  . 0.9 %  sodium chloride infusion   Intravenous Continuous Lance Coon, MD 100 mL/hr at 06/26/15 1025    . enoxaparin (LOVENOX) injection 40 mg  40 mg Subcutaneous Q24H Lance Coon, MD      . folic acid (FOLVITE) tablet 1 mg  1 mg Oral Daily Lance Coon, MD   1 mg at 06/26/15 1012  . LORazepam (ATIVAN)  tablet 0-4 mg  0-4 mg Oral Q6H Lance Coon, MD   1 mg at 06/26/15 1012   Followed by  . [START ON 06/28/2015] LORazepam (ATIVAN) tablet 0-4 mg  0-4 mg Oral Q12H Lance Coon, MD      . multivitamin with minerals tablet 1 tablet  1 tablet Oral Daily Lance Coon, MD   1 tablet at 06/26/15 1012  . nicotine (NICOTROL) 10 MG inhaler 1 continuous puffing  1 continuous puffing Inhalation PRN Hinda Kehr, MD   1 continuous puffing at 06/26/15 0528  . OLANZapine (ZYPREXA) tablet 10 mg  10 mg Oral QHS Hinda Kehr, MD   10 mg at 06/25/15 2358  . [START ON 06/27/2015] pneumococcal 23 valent vaccine (PNU-IMMUNE) injection 0.5 mL  0.5 mL Intramuscular Tomorrow-1000 Lance Coon, MD      . sodium chloride 0.9 % injection 3 mL  3 mL Intravenous Q12H Lance Coon, MD   3 mL at 06/26/15 0255  . thiamine (VITAMIN B-1) tablet 100 mg  100 mg Oral Daily Lance Coon, MD   100 mg at 06/26/15 1012   Or  . thiamine (B-1) injection 100 mg  100 mg Intravenous Daily Lance Coon, MD        Musculoskeletal: Strength & Muscle Tone: within normal limits Gait & Station: normal Patient leans: N/A  Psychiatric Specialty Exam: Physical Exam  Nursing note and vitals reviewed. Constitutional: He appears well-developed and well-nourished.  HENT:  Head: Normocephalic and atraumatic.  Eyes: Conjunctivae are normal. Pupils are equal, round, and reactive to light.  Neck: Normal range of motion.  Cardiovascular: Normal heart sounds.  Respiratory: Effort normal.  GI: Soft.  Musculoskeletal: Normal range of motion.  Neurological: He is alert.  Skin: Skin is warm and dry.  Psychiatric: His speech is normal. Thought content normal. His affect is labile. He is agitated. Cognition and memory are impaired. He expresses impulsivity. He exhibits abnormal recent memory.    Review of Systems  Constitutional: Negative.   HENT: Negative.   Eyes: Negative.   Respiratory: Negative.   Cardiovascular: Negative.   Gastrointestinal:  Negative.   Musculoskeletal: Negative.   Skin: Negative.   Neurological: Negative.   Psychiatric/Behavioral: Positive for substance abuse. Negative for depression, suicidal ideas, hallucinations and memory loss. The patient is not nervous/anxious and does not have insomnia.     Blood pressure 126/85, pulse 83, temperature 97.5 F (36.4 C), temperature source Oral, resp. rate 18, height 5' 5"  (1.651 m), weight 55.52 kg (122 lb 6.4 oz), SpO2 100 %.Body mass index is 20.37 kg/(m^2).  General Appearance: Disheveled  Eye Contact::  Minimal  Speech:  Pressured  Volume:  Increased  Mood:  Anxious  Affect:  Labile  Thought Process:  Linear  Orientation:  Full (Time, Place, and Person)  Thought Content:  Negative  Suicidal Thoughts:  No  Homicidal Thoughts:  No  Memory:  Immediate;   Good Recent;   Fair Remote;   Fair  Judgement:  Impaired  Insight:  Lacking  Psychomotor Activity:  Negative  Concentration:  Fair  Recall:  [employer] of Knowledge:Fair  Language: Fair  Akathisia:  No  Handed:  Right  AIMS (if indicated):     Assets:  Communication Skills Desire for Improvement Resilience Social Support  ADL's:  Intact  Cognition: WNL  Sleep:      Medical Decision Making: Review of Psycho-Social Stressors (1), Review or order clinical lab tests (1), Established Problem, Worsening (2) and Review of Medication Regimen & Side Effects (2)  Treatment Plan Summary: Plan Patient is currently completely denying suicidal or homicidal ideation. He has not shown any dangerous behavior. He makes grandiose statements but does not appear to be grossly psychotic. Patient does not meet commitment criteria. Internal medicine is satisfied that he is no longer medically unstable. Patient has been counseled about the multiple medical problems related to his continued drinking and strongly encouraged to go back to [facility] and continue to follow-up with outpatient substance abuse treatment. Also informed again  that I think he has bipolar disorder and that he should consider seeing a mental health provider for that. Patient is ambivalent about whether he will follow-up with any of that. Commitment discontinued. Psychoeducation done. No new prescriptions. Case discussed with physician Dr. Volanda Napoleon.  Plan:  Patient does not meet criteria for psychiatric inpatient admission. Supportive therapy provided about ongoing stressors. Discussed crisis plan, support from social network, calling 911, coming to the Emergency Department, and calling Suicide Hotline. discontinue IVC. Psychoeducation completed. No prescriptions. Refer back to primary care doctor and outpatient mental health treatment see above see above Disposition:       Alethia Berthold 06/26/2015 5:17 PM

## 2015-06-26 NOTE — ED Notes (Signed)
Pt to be transferred back to main ED QUAD due to pt needs IV and fluids and to be admitted medically.  Pt informed of move and was transferred with assistance of ED [facility] and [employer] Officer Vear Clock.  In formed RN Nellie that pt is on his way back to the main ED.

## 2015-06-26 NOTE — Plan of Care (Signed)
Problem: Discharge Progression Outcomes Goal: Other Discharge Outcomes/Goals Outcome: Progressing Pt admitted IVC - currently has Recruitment consultant.  Denies suicidal ideation.  "I'm not that crazy- you got to hell for that".  States he feels great.  Is Ox4. No s/sx of withdrawal from ETOH abuse.  Has Hx of bipolar - possibly in manic phase.  Pt has family hx of hyponatremia - currently is 76 which is close to his baseline.  Still getting NS IVF.  Hospitalist states that he's medically stable and ready for d/c.  Will depend on eval by psyche consult whether he's d/ced or transferred to behavioral med.  Pt has housing issues.  Currently checked into [facility] in [place] but cancelled his reservation thru 9/26.  States that he suffers chronic hip/shoulder and back pain from snow skiing.  Also is c/o swimmer's ear in [place].

## 2015-06-27 NOTE — Progress Notes (Signed)
[facility]         [place], [place].   06/27/2015  Patient: Jacob Velez   Date of Birth:  1961-09-15  Date of admission:  06/25/2015  Date of Discharge  06/26/2015    To Whom it May Concern:   Fermon Ureta  may return to work on 06/28/2015.  WORK-EMPLOYMENT:  Full Duty  If you have any questions or concerns, please don't hesitate to call.  Sincerely,   Elby Showers M.D Office : [phone]   .

## 2015-06-28 ENCOUNTER — Telehealth: Payer: Self-pay | Admitting: Adult Health

## 2015-06-28 ENCOUNTER — Telehealth: Payer: Self-pay | Admitting: Internal Medicine

## 2015-06-28 NOTE — Telephone Encounter (Signed)
Pt called stating he cannot afford the medication that Dr Darrick Huntsman prescribed. Pt would like a call back. Thank You!

## 2015-06-28 NOTE — Telephone Encounter (Signed)
Left message on VM to return call to verify which medication is too expensive.

## 2015-06-29 NOTE — Discharge Summary (Signed)
[facility] [place] at [facility]  DISCHARGE SUMMARY   PATIENT NAME: Jacob Velez    MR#:  [id number]  DATE OF BIRTH:  06/02/1961  DATE OF ADMISSION:  06/25/2015 ADMITTING PHYSICIAN: Oralia Manis, MD  DATE OF DISCHARGE: 06/26/2015  PRIMARY CARE PHYSICIAN: Novella Olive, NP    ADMISSION DIAGNOSIS:  Hyponatremia [E87.1] Alcohol dependence with unspecified alcohol-induced disorder [F10.29]  DISCHARGE DIAGNOSIS:  Principal Problem:   Alcohol intoxication Active Problems:   Essential hypertension, benign   Hyponatremia   Suicidal ideation   Seizures   Bipolar disorder   SECONDARY DIAGNOSIS:   Past Medical History  Diagnosis Date  . Hypertension   . Seizures 2004    due to dehydration, low sodium   . H/O staphylococcal infection Childhood    right leg. Chigger bite and scratched "with dirty nails"  . Alcohol abuse   . Bipolar 1 disorder     HOSPITAL COURSE:    #1 suicidal ideation: Patient denies suicidal ideation. States that this story was made up by his roommate's girlfriend. He does have a history of bipolar disorder and seems manic at this time. Psychiatry saw him on the day of discharge and removed the IVC. He does not need inpatient treatment at this time. Recommendations from psychiatry are simply that he abstain from alcohol abuse, maintain compliance with his psychiatric medications and with his outpatient psychiatric treatment.  #2 hyponatremia: initial sodium 120, with IV fluids to 129. His baseline seems to be at 130. 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 Back to baseline and stable.  #3 alcohol abuse: On presentation had elevated alcohol level at 242. Not currently showing signs of withdrawal other than possible anxiety. Blood pressure heart rate normal. No tremors or diaphoresis. He does have a history of DTs. He denies excessive alcohol consumption in the recent past. He has had 2 mg of  Ativan today so far over 24 hours. He does not intend to quit drinking after discharge. He does not wish for inpatient detoxification.  #4 hypertension: Blood pressure well controlled at this time no antihypertensives  #5 history of seizure disorder: Not on any chronic antiepileptics. No seizure episodes at this time.   DISCHARGE CONDITIONS:   Fair  CONSULTS OBTAINED:  Treatment Team:  Audery Amel, MD  DRUG ALLERGIES:  No Known Allergies  DISCHARGE MEDICATIONS:   Discharge Medication List as of 06/26/2015  5:12 PM    START taking these medications   Details  OLANZapine (ZYPREXA) 10 MG tablet Take 1 tablet (10 mg total) by mouth at bedtime., Starting 06/26/2015, Until Discontinued, Normal      CONTINUE these medications which have NOT CHANGED   Details  atenolol (TENORMIN) 100 MG tablet TAKE 1 TABLET (100 MG TOTAL) BY MOUTH 2 (TWO) TIMES DAILY., Normal    chlordiazePOXIDE (LIBRIUM) 25 MG capsule 1-2 capsules every 8 hours for 48 hours 1 capsule every 8 hours for 48 hours  1 capsule every 12 hours for 48 hours 1 capsule daily thereafter, Print    varenicline (CHANTIX CONTINUING MONTH PAK) 1 MG tablet Take 1 tablet (1 mg total) by mouth 2 (two) times daily., Starting 07/03/2014, Until Discontinued, Print    varenicline (CHANTIX STARTING MONTH PAK) 0.5 MG X 11 & 1 MG X 42 tablet Take one 0.5 mg tablet by mouth once daily for 3 days, then increase to one 0.5 mg tablet twice daily for 4 days, then increase to one  1 mg tablet twice daily., Print         DISCHARGE INSTRUCTIONS:    Patient discharged in fair condition. Not in need of home health services. Maintain normal diet.  If you experience worsening of your admission symptoms, develop shortness of breath, life threatening emergency, suicidal or homicidal thoughts you must seek medical attention immediately by calling 911 or calling your MD immediately  if symptoms less severe.  You Must read complete  instructions/literature along with all the possible adverse reactions/side effects for all the Medicines you take and that have been prescribed to you. Take any new Medicines after you have completely understood and accept all the possible adverse reactions/side effects.   Please note  You were cared for by a hospitalist during your hospital stay. If you have any questions about your discharge medications or the care you received while you were in the hospital after you are discharged, you can call the unit and asked to speak with the hospitalist on call if the hospitalist that took care of you is not available. Once you are discharged, your primary care physician will handle any further medical issues. Please note that NO REFILLS for any discharge medications will be authorized once you are discharged, as it is imperative that you return to your primary care physician (or establish a relationship with a primary care physician if you do not have one) for your aftercare needs so that they can reassess your need for medications and monitor your lab values.    Today   CHIEF COMPLAINT:   Chief Complaint  Patient presents with  . Behavior Problem    HISTORY OF PRESENT ILLNESS:   Jacob Velez is a 54 y.o. male who presents with suicidal ideation and alcohol intoxication. Patient gives a very tangential and unclear history. Collateral history taken from the ED physician who also spoke with police, who brought the patient in to the ED initially. Patient has been IVC for suicidal ideation. However, on evaluation in the ED his alcohol level was significantly elevated, and his sodium was found to be 120. Patient does have a history of seizures in the past. Hospitalists were called for admission for significant hyponatremia and alcohol intoxication requiring monitoring during withdrawal. Patient denies any symptoms.  VITAL SIGNS:  Blood pressure 126/85, pulse 83, temperature 97.5 F (36.4 C),  temperature source Oral, resp. rate 18, height  (1.651 m), weight 55.52 kg (122 lb 6.4 oz), SpO2 100 %.  I/O:  No intake or output data in the 24 hours ending 06/29/15 1056  PHYSICAL EXAMINATION:  GENERAL: 54 y.o.-year-old patient sitting up, no distress, thin EYES: Pupils equal, round, reactive to light and accommodation. No scleral icterus. Extraocular muscles intact.  HEENT: Head atraumatic, normocephalic. Oropharynx and nasopharynx clear.  NECK: Supple, no jugular venous distention. No thyroid enlargement, no tenderness.  LUNGS: Normal breath sounds bilaterally, no wheezing, rales,rhonchi or crepitation. No use of accessory muscles of respiration.  CARDIOVASCULAR: S1, S2 normal. No murmurs, rubs, or gallops.  ABDOMEN: Soft, nontender, nondistended. Bowel sounds present. No organomegaly or mass.  EXTREMITIES: No pedal edema, cyanosis, or clubbing.  NEUROLOGIC: Cranial nerves II through XII are intact. Muscle strength 5/5 in all extremities. Sensation intact. Gait not checked.  PSYCHIATRIC: The patient is alert and oriented x 3. Very talkative.  SKIN: No obvious rash, lesion, or ulcer.   DATA REVIEW:   CBC  Recent Labs Lab 06/26/15 0247  WBC 5.0  HGB 16.4  HCT 46.8  PLT 200  Chemistries   Recent Labs Lab 06/25/15 1714 06/26/15 0247  06/26/15 1419  NA 120* 129*  127*  < > 129*  K 4.1 4.6  --   --   CL 84* 96*  --   --   CO2 24 22  --   --   GLUCOSE 86 80  --   --   BUN <5* <5*  --   --   CREATININE 0.62 0.66  --   --   CALCIUM 8.7* 8.7*  --   --   MG  --  2.0  --   --   AST 69*  --   --   --   ALT 28  --   --   --   ALKPHOS 55  --   --   --   BILITOT 0.4  --   --   --   < > = values in this interval not displayed.  Cardiac Enzymes No results for input(s): TROPONINI in the last 168 hours.  Microbiology Results  No results found for this or any previous visit.  RADIOLOGY:  No results found.  EKG:   Orders placed or performed during the  hospital encounter of 06/25/15  . ED EKG  . ED EKG  . EKG 12-Lead  . EKG 12-Lead  . EKG      Management plans discussed with the patient, family and they are in agreement.  CODE STATUS: Full  TOTAL TIME TAKING CARE OF THIS PATIENT: 35 minutes.  Greater than 50% of time spent in care coordination and counseling.  Elby Showers M.D on 06/29/2015 at 10:56 AM  Between 7am to 6pm - Pager - [phone]  After 6pm go to [email or web address] - password [email or web address] [facility]  [facility]  Office  [phone]  CC: Primary care physician; Novella Olive, NP

## 2015-07-01 NOTE — Telephone Encounter (Signed)
Spoke with the patient, he was referring to the Zyprexa that the ER prescribed him.  He is taking the Librium with no difficulties.  He is concerned that once he is done with it, what will he be able to take that won't make him have a high feeling, or constipate him but help relax him.  He also apologized for his behavior prior.  Please advise?

## 2015-07-01 NOTE — Telephone Encounter (Signed)
Jacob Velez,  I sent yo [facility] staff message  to terminate this  patient last week after he yelled at the staff when they tried to call him with the results of his labs.  Have you sent him the letter yet?  The history is this: I saw him on Sept 16th .  He requested assistance in quitting drinking.  He had not been seen in over a year.   He was given Librium taper for alcohol  Detox treatment on September 16th,   He did Did not keep his appt on Sept 19th because he was involuntarily committed on the 19th due to alcohol intoxication and suicidality .  Since He was inebriated at the time of admission and also told Dr Clent Ridges at discharge  that he had no intention of abstaining from alcohol  , I will NOT give him any controlled substances during the 30 days that I am required to treat him while he finds himself another physician

## 2015-07-02 ENCOUNTER — Telehealth: Payer: Self-pay | Admitting: Internal Medicine

## 2015-07-02 ENCOUNTER — Telehealth: Payer: Self-pay | Admitting: Adult Health

## 2015-07-02 ENCOUNTER — Telehealth: Payer: Self-pay

## 2015-07-02 NOTE — Telephone Encounter (Signed)
Called patient DPR patient DPR stated that patient has dramatically declined, and his boss is willing to do anything to help patient stated he is a great employee but has problem with bipolar patient boss ins willing to drug test patient what ever it takes to get him help.?

## 2015-07-02 NOTE — Telephone Encounter (Signed)
Pt called about his  chlordiazePOXIDE (LIBRIUM) 25 MG capsule  Medication     Pt is about to run out of his medication. Pt states he has about 10 more days of medication. Appt was given for 10/14. Pt would like a sooner appt. Pharmacy is [facility] on [address] [place]!

## 2015-07-02 NOTE — Telephone Encounter (Signed)
Pt boss Jannetta Quint [phone]  (is on pt DPR) would  like for someone to give him a call to go over Mr Macomber.Marland Kitchen

## 2015-07-02 NOTE — Telephone Encounter (Signed)
He has been dismissed from the practice because he continued to drink while taking the librium taper that I prescribed for him at his recent visit, and because he told the hospitalist that he had no intention to stop drinking.

## 2015-07-02 NOTE — Telephone Encounter (Signed)
Ok,  You can let the patient know that I will NOT be prescribing any controlled substances to him because he continued drinking while he was taking the Librium I prescribed for alcohol detox.

## 2015-07-02 NOTE — Telephone Encounter (Signed)
Dismissal Process started in HIM.

## 2015-07-02 NOTE — Telephone Encounter (Signed)
Patient dismissed from [facility] by Duncan Dull MD , effective July 02, 2015. Dismissal letter sent out by certified / registered mail.  DAJ  Received signed domestic return receipt verifying delivery of certified letter on Jul 17, 2015. Article number [id number] [id number]

## 2015-07-02 NOTE — Telephone Encounter (Signed)
Paperwork is available for you to sign.  Thanks

## 2015-07-03 NOTE — Telephone Encounter (Signed)
Called and notified patient he will receive certified letter of release from practice.

## 2015-07-19 ENCOUNTER — Ambulatory Visit: Payer: No Typology Code available for payment source | Admitting: Internal Medicine

## 2015-10-08 ENCOUNTER — Other Ambulatory Visit: Payer: Self-pay | Admitting: Internal Medicine

## 2016-03-20 NOTE — Telephone Encounter (Signed)
error 

## 2016-07-19 ENCOUNTER — Encounter: Payer: Self-pay | Admitting: Emergency Medicine

## 2016-07-19 ENCOUNTER — Emergency Department: Payer: Self-pay

## 2016-07-19 ENCOUNTER — Inpatient Hospital Stay
Admission: EM | Admit: 2016-07-19 | Discharge: 2016-07-22 | DRG: 640 | Disposition: A | Discharge status: Home / Self Care | Payer: Self-pay | Attending: Internal Medicine | Admitting: Internal Medicine

## 2016-07-19 DIAGNOSIS — F1010 Alcohol abuse, uncomplicated: Secondary | ICD-10-CM

## 2016-07-19 DIAGNOSIS — Z96641 Presence of right artificial hip joint: Secondary | ICD-10-CM | POA: Diagnosis present

## 2016-07-19 DIAGNOSIS — E871 Hypo-osmolality and hyponatremia: Principal | ICD-10-CM

## 2016-07-19 DIAGNOSIS — F10929 Alcohol use, unspecified with intoxication, unspecified: Secondary | ICD-10-CM | POA: Diagnosis present

## 2016-07-19 DIAGNOSIS — Z79899 Other long term (current) drug therapy: Secondary | ICD-10-CM

## 2016-07-19 DIAGNOSIS — R531 Weakness: Secondary | ICD-10-CM

## 2016-07-19 DIAGNOSIS — F10129 Alcohol abuse with intoxication, unspecified: Secondary | ICD-10-CM | POA: Diagnosis present

## 2016-07-19 DIAGNOSIS — F3181 Bipolar II disorder: Secondary | ICD-10-CM | POA: Diagnosis present

## 2016-07-19 DIAGNOSIS — G9341 Metabolic encephalopathy: Secondary | ICD-10-CM | POA: Diagnosis present

## 2016-07-19 DIAGNOSIS — F10920 Alcohol use, unspecified with intoxication, uncomplicated: Secondary | ICD-10-CM

## 2016-07-19 DIAGNOSIS — F17210 Nicotine dependence, cigarettes, uncomplicated: Secondary | ICD-10-CM | POA: Diagnosis present

## 2016-07-19 DIAGNOSIS — I10 Essential (primary) hypertension: Secondary | ICD-10-CM | POA: Diagnosis present

## 2016-07-19 LAB — CBC
HCT: 45.2 % (ref 40.0–52.0)
Hemoglobin: 15.8 g/dL (ref 13.0–18.0)
MCH: 35.5 pg — ABNORMAL HIGH (ref 26.0–34.0)
MCHC: 35 g/dL (ref 32.0–36.0)
MCV: 101.2 fL — AB (ref 80.0–100.0)
PLATELETS: 246 10*3/uL (ref 150–440)
RBC: 4.46 MIL/uL (ref 4.40–5.90)
RDW: 12 % (ref 11.5–14.5)
WBC: 7.7 10*3/uL (ref 3.8–10.6)

## 2016-07-19 LAB — BASIC METABOLIC PANEL
Anion gap: 11 (ref 5–15)
CALCIUM: 8.5 mg/dL — AB (ref 8.9–10.3)
CHLORIDE: 82 mmol/L — AB (ref 101–111)
CO2: 22 mmol/L (ref 22–32)
CREATININE: 0.58 mg/dL — AB (ref 0.61–1.24)
Glucose, Bld: 90 mg/dL (ref 65–99)
Potassium: 3.6 mmol/L (ref 3.5–5.1)
SODIUM: 115 mmol/L — AB (ref 135–145)

## 2016-07-19 LAB — URINALYSIS COMPLETE WITH MICROSCOPIC (ARMC ONLY)
BILIRUBIN URINE: NEGATIVE
Bacteria, UA: NONE SEEN
Glucose, UA: NEGATIVE mg/dL
Hgb urine dipstick: NEGATIVE
LEUKOCYTES UA: NEGATIVE
Nitrite: NEGATIVE
PH: 6 (ref 5.0–8.0)
Protein, ur: NEGATIVE mg/dL
RBC / HPF: NONE SEEN RBC/hpf (ref 0–5)
SPECIFIC GRAVITY, URINE: 1.002 — AB (ref 1.005–1.030)
WBC, UA: NONE SEEN WBC/hpf (ref 0–5)

## 2016-07-19 MED ORDER — SODIUM CHLORIDE 0.9 % IV BOLUS (SEPSIS)
1000.0000 mL | Freq: Once | INTRAVENOUS | Status: AC
  Administered 2016-07-19: 1000 mL via INTRAVENOUS

## 2016-07-19 NOTE — ED Triage Notes (Addendum)
Pt brought to ED by [employer] officer [employer] for medical evaluation; pt had driven to what he thought was his girlfriend's house and trying to get inside; [place] and [place] just trying to get [place]; pt admits to drinking alcohol; denies any drug use; pupils pinpoint; pt awake and alert; talking in complete coherent sentences; officer says pt was very unsteady on his feet on the scene, he "was like a limp rag doll";

## 2016-07-20 LAB — BASIC METABOLIC PANEL
ANION GAP: 10 (ref 5–15)
CO2: 20 mmol/L — AB (ref 22–32)
Calcium: 7.9 mg/dL — ABNORMAL LOW (ref 8.9–10.3)
Chloride: 95 mmol/L — ABNORMAL LOW (ref 101–111)
Creatinine, Ser: 0.49 mg/dL — ABNORMAL LOW (ref 0.61–1.24)
GFR calc Af Amer: >60 mL/min (ref >60)
GFR calc non Af Amer: >60 mL/min (ref >60)
GLUCOSE: 183 mg/dL — AB (ref 65–99)
POTASSIUM: 3.6 mmol/L (ref 3.5–5.1)
Sodium: 125 mmol/L — ABNORMAL LOW (ref 135–145)

## 2016-07-20 LAB — URINE DRUG SCREEN, QUALITATIVE (ARMC ONLY)
Amphetamines, Ur Screen: NOT DETECTED
BARBITURATES, UR SCREEN: NOT DETECTED
BENZODIAZEPINE, UR SCRN: NOT DETECTED
CANNABINOID 50 NG, UR ~~LOC~~: NOT DETECTED
COCAINE METABOLITE, UR ~~LOC~~: NOT DETECTED
MDMA (Ecstasy)Ur Screen: NOT DETECTED
Methadone Scn, Ur: NOT DETECTED
Opiate, Ur Screen: NOT DETECTED
Phencyclidine (PCP) Ur S: NOT DETECTED
TRICYCLIC, UR SCREEN: NOT DETECTED

## 2016-07-20 LAB — CBC
HEMATOCRIT: 40.7 % (ref 40.0–52.0)
HEMOGLOBIN: 14.9 g/dL (ref 13.0–18.0)
MCH: 36.6 pg — AB (ref 26.0–34.0)
MCHC: 36.6 g/dL — AB (ref 32.0–36.0)
MCV: 100.1 fL — ABNORMAL HIGH (ref 80.0–100.0)
Platelets: 192 10*3/uL (ref 150–440)
RBC: 4.07 MIL/uL — ABNORMAL LOW (ref 4.40–5.90)
RDW: 12.4 % (ref 11.5–14.5)
WBC: 3.8 10*3/uL (ref 3.8–10.6)

## 2016-07-20 LAB — PHOSPHORUS: Phosphorus: 2.8 mg/dL (ref 2.5–4.6)

## 2016-07-20 LAB — SODIUM
SODIUM: 123 mmol/L — AB (ref 135–145)
Sodium: 125 mmol/L — ABNORMAL LOW (ref 135–145)
Sodium: 126 mmol/L — ABNORMAL LOW (ref 135–145)

## 2016-07-20 LAB — TROPONIN I: Troponin I: <0.03 ng/mL (ref <0.03)

## 2016-07-20 LAB — ETHANOL: ALCOHOL ETHYL (B): 187 mg/dL — AB (ref <5)

## 2016-07-20 LAB — MAGNESIUM: Magnesium: 1.8 mg/dL (ref 1.7–2.4)

## 2016-07-20 MED ORDER — THIAMINE HCL 100 MG/ML IJ SOLN: 100.0000 mg | Freq: Every day | INTRAMUSCULAR | Status: DC

## 2016-07-20 MED ORDER — SODIUM CHLORIDE 0.9% FLUSH
3.0000 mL | Freq: Two times a day (BID) | INTRAVENOUS | Status: DC
  Administered 2016-07-20: 3 mL via INTRAVENOUS

## 2016-07-20 MED ORDER — LORAZEPAM 2 MG/ML IJ SOLN
1.0000 mg | Freq: Four times a day (QID) | INTRAMUSCULAR | Status: DC | PRN
  Administered 2016-07-21: 1 mg via INTRAVENOUS
  Filled 2016-07-20: qty 1

## 2016-07-20 MED ORDER — HYDROCODONE-ACETAMINOPHEN 5-325 MG PO TABS: 1.0000 | ORAL_TABLET | ORAL | Status: DC | PRN

## 2016-07-20 MED ORDER — VITAMIN B-1 100 MG PO TABS
100.0000 mg | ORAL_TABLET | Freq: Every day | ORAL | Status: DC
  Administered 2016-07-20 – 2016-07-21 (×2): 100 mg via ORAL
  Filled 2016-07-20 (×3): qty 1

## 2016-07-20 MED ORDER — ONDANSETRON HCL 4 MG/2ML IJ SOLN: 4.0000 mg | Freq: Four times a day (QID) | INTRAMUSCULAR | Status: DC | PRN

## 2016-07-20 MED ORDER — ADULT MULTIVITAMIN W/MINERALS CH
1.0000 | ORAL_TABLET | Freq: Every day | ORAL | Status: DC
  Administered 2016-07-20 – 2016-07-21 (×2): 1 via ORAL
  Filled 2016-07-20 (×3): qty 1

## 2016-07-20 MED ORDER — SENNOSIDES-DOCUSATE SODIUM 8.6-50 MG PO TABS: 1.0000 | ORAL_TABLET | Freq: Every evening | ORAL | Status: DC | PRN

## 2016-07-20 MED ORDER — LORAZEPAM 1 MG PO TABS
1.0000 mg | ORAL_TABLET | Freq: Four times a day (QID) | ORAL | Status: DC | PRN
  Administered 2016-07-20: 1 mg via ORAL
  Filled 2016-07-20: qty 1

## 2016-07-20 MED ORDER — ZOLPIDEM TARTRATE 5 MG PO TABS: 5.0000 mg | ORAL_TABLET | Freq: Every evening | ORAL | Status: DC | PRN

## 2016-07-20 MED ORDER — BISACODYL 5 MG PO TBEC: 5.0000 mg | DELAYED_RELEASE_TABLET | Freq: Every day | ORAL | Status: DC | PRN

## 2016-07-20 MED ORDER — FOLIC ACID 1 MG PO TABS
1.0000 mg | ORAL_TABLET | Freq: Every day | ORAL | Status: DC
  Administered 2016-07-20 – 2016-07-21 (×2): 1 mg via ORAL
  Filled 2016-07-20 (×3): qty 1

## 2016-07-20 MED ORDER — ENOXAPARIN SODIUM 40 MG/0.4ML ~~LOC~~ SOLN
40.0000 mg | SUBCUTANEOUS | Status: DC
  Administered 2016-07-21 – 2016-07-22 (×2): 40 mg via SUBCUTANEOUS
  Filled 2016-07-20 (×2): qty 0.4

## 2016-07-20 MED ORDER — ONDANSETRON HCL 4 MG PO TABS: 4.0000 mg | ORAL_TABLET | Freq: Four times a day (QID) | ORAL | Status: DC | PRN

## 2016-07-20 MED ORDER — SODIUM CHLORIDE 0.9 % IV SOLN
INTRAVENOUS | Status: DC
Start: 2016-07-20 — End: 2016-07-20
  Administered 2016-07-20: 05:00:00 via INTRAVENOUS

## 2016-07-20 MED ORDER — ACETAMINOPHEN 650 MG RE SUPP
650.0000 mg | Freq: Four times a day (QID) | RECTAL | Status: DC | PRN
Start: 2016-07-20 — End: 2016-07-22

## 2016-07-20 MED ORDER — ACETAMINOPHEN 325 MG PO TABS: 650.0000 mg | ORAL_TABLET | Freq: Four times a day (QID) | ORAL | Status: DC | PRN

## 2016-07-20 MED ORDER — MAGNESIUM CITRATE PO SOLN: 1.0000 | Freq: Once | ORAL | Status: DC | PRN

## 2016-07-20 NOTE — ED Notes (Signed)
Transporting patient to room 1A-

## 2016-07-20 NOTE — ED Notes (Signed)
MD at bedside. 

## 2016-07-20 NOTE — ED Notes (Signed)
Patient transported to CT 

## 2016-07-20 NOTE — ED Notes (Signed)
Transporting patient to room 154-1A

## 2016-07-20 NOTE — Progress Notes (Signed)
[facility] [place] at [facility]   PATIENT NAME: Jacob Velez    MR#:  [id number]  DATE OF BIRTH:  Apr 05, 1961  SUBJECTIVE:   Patient appears to be confused at times Says he dies not drink and he has been taking care of his mom However, Etoh level was increased on admission.  REVIEW OF SYSTEMS:    Review of Systems  Unable to perform ROS: Other  he seems confused at times Not sure his ROS can be adequately assessed  Tolerating Diet: yes      DRUG ALLERGIES:  No Known Allergies  VITALS:  Blood pressure 134/74, pulse (!) 107, temperature 98.3 F (36.8 C), temperature source Oral, resp. rate 18, height 5' 5.5" (1.664 m), weight 54.4 kg (120 lb), SpO2 97 %.  PHYSICAL EXAMINATION:   Physical Exam    LABORATORY PANEL:   CBC  Recent Labs Lab 07/20/16 0627  WBC 3.8  HGB 14.9  HCT 40.7  PLT 192   ------------------------------------------------------------------------------------------------------------------  Chemistries   Recent Labs Lab 07/20/16 0627 07/20/16 0804  NA 125* 125*  K 3.6  --   CL 95*  --   CO2 20*  --   GLUCOSE 183*  --   BUN <5*  --   CREATININE 0.49*  --   CALCIUM 7.9*  --   MG 1.8  --    ------------------------------------------------------------------------------------------------------------------  Cardiac Enzymes  Recent Labs Lab 07/19/16 2246  TROPONINI <0.03   ------------------------------------------------------------------------------------------------------------------  RADIOLOGY:  Ct Head Wo Contrast  Result Date: 07/20/2016 CLINICAL DATA:  55 y/o  M; confusion. EXAM: CT HEAD WITHOUT CONTRAST TECHNIQUE: Contiguous axial images were obtained from the base of the skull through the vertex without intravenous contrast. COMPARISON:  06/18/2012 CT head. FINDINGS: Brain: No evidence of acute infarction, hemorrhage, hydrocephalus, extra-axial collection or mass lesion/mass effect. Stable nonspecific  foci of hypoattenuation in subcortical and periventricular white matter compatible with mild chronic microvascular ischemic changes. Vascular: No hyperdense vessel or unexpected calcification. Skull: Normal. Negative for fracture or focal lesion. Sinuses/Orbits: Left maxillary sinus mucous retention cysts, otherwise normally aerated paranasal sinuses and mastoid air cells. Orbits are unremarkable. Other: None. IMPRESSION: 1. No acute intracranial abnormality is identified. 2. Stable mild chronic microvascular ischemic changes. Electronically Signed   By: Mitzi HansenLance  Furusawa-Stratton M.D.   On: 07/20/2016 01:41     ASSESSMENT AND PLAN:    55 year old male with a history of EtOH abuse and bipolar disease who presents with near syncope and found to have hyponatremia.   1. Acute metabolic encephalopathy due to hyponatremia: Sodium level this morning is 125. I will stop IV fluids and repeat BMP due to the 10 point increase in sodium level. Nephrology consult.  2. EtOH abuse: Continue CIWA protocol  3. [facility] of [facility]. Appears the patient was on Depakote in the past.  Management plans discussed with the patient and he is in agreement.  CODE STATUS: full  TOTAL TIME TAKING CARE OF THIS PATIENT: 30 minutes.     POSSIBLE D/C ??, DEPENDING ON CLINICAL CONDITION.   Desirey Keahey M.D on 07/20/2016 at 11:53 AM  Between 7am to 6pm - Pager - [phone] After 6pm go to [email or web address] - Social research officer, [email or web address] [email or web address]  [facility]  Office  [phone]  CC: Primary care physician; No PCP Per Patient  Note: This dictation was prepared with Dragon dictation along with smaller phrase technology. Any transcriptional errors that result from this process are unintentional.

## 2016-07-20 NOTE — H&P (Signed)
[facility] - [place] @ [facility] Admission History and Physical [employer], D.O.  ---------------------------------------------------------------------------------------------------------------------   PATIENT NAME: Jacob Velez MR#: [id number] DATE OF BIRTH: [birth year] DATE OF ADMISSION: 07/19/2016 PRIMARY CARE PHYSICIAN: No PCP Per Patient  REQUESTING/REFERRING PHYSICIAN: ED Dr. Zenda Alpers  CHIEF COMPLAINT: Chief Complaint  Patient presents with  . Near Syncope    HISTORY OF PRESENT ILLNESS: Jacob Velez is a 55 y.o. male with a known history of EtOH use disorder, bipolar, hypertension, seizures, hyponatremia presents to the emergency department for evaluation of near syncope.  Patient was in a usual state of health until this evening when patient was found to be confused, walking to various houses around his girlfriend's house, unsure which one was hers. Please attended to do field sobriety test but when they held him up he slumped over with confusion and disorientation. He reportedly could not stand on his own. At present patient denies any complaints any recent symptoms no shortness of breath or chest pain and nausea vomiting fevers or chills. He denies drinking alcohol. Please delivered the patient to the emergency department for evaluation. In the emergency room patient received 2 L normal saline  Otherwise there has been no change in status. Patient has been taking medication as prescribed and there has been no recent change in medication or diet.  There has been no recent illness, travel or sick contacts.    Patient denies fevers/chills, weakness, dizziness, chest pain, shortness of breath, N/V/C/D, abdominal pain, dysuria/frequency, changes in mental status.    PAST MEDICAL HISTORY: Past Medical History:  Diagnosis Date  . Alcohol abuse   . Bipolar 1 disorder (HCC)   . H/O staphylococcal infection Childhood   right leg. Chigger bite and scratched "with dirty  nails"  . Hypertension   . Seizures (HCC) 2004   due to dehydration, low sodium       PAST SURGICAL HISTORY: Past Surgical History:  Procedure Laterality Date  . JOINT REPLACEMENT Right 2012   right hip replacement  . REPLACEMENT TOTAL HIP W/  RESURFACING IMPLANTS Right       SOCIAL HISTORY: Social History  Substance Use Topics  . Smoking status: Current Every Day Smoker    Packs/day: 0.50    Years: 20.00    Types: Cigarettes  . Smokeless tobacco: Never Used  . Alcohol use 0.0 oz/week    5 - 7 Cans of beer per week     Comment: admits to drinking today      FAMILY HISTORY: Family History  Problem Relation Age of Onset  . Seizures Mother   . Stroke Father 54    2/2 chemicals exposed to during [place]. He was a Occupational hygienist  . Cancer Neg Hx      MEDICATIONS AT HOME: Prior to Admission medications   Medication Sig Start Date End Date Taking? Authorizing Provider  clonazePAM (KLONOPIN) 0.5 MG tablet Take 0.5 mg by mouth 2 (two) times daily as needed for anxiety.   Yes Historical Provider, MD  chlordiazePOXIDE (LIBRIUM) 25 MG capsule 1-2 capsules every 8 hours for 48 hours 1 capsule every 8 hours for 48 hours  1 capsule every 12 hours for 48 hours 1 capsule daily thereafter 06/21/15   Sherlene Shams, MD      DRUG ALLERGIES: No Known Allergies   REVIEW OF SYSTEMS: CONSTITUTIONAL: No fatigue, weakness, fever, chills, weight gain/loss, headache EYES: No blurry or double vision. ENT: No tinnitus, postnasal drip, redness or soreness of the oropharynx. RESPIRATORY: No dyspnea,  cough, wheeze, hemoptysis. CARDIOVASCULAR: No chest pain, orthopnea, palpitations, syncope. GASTROINTESTINAL: No nausea, vomiting, constipation, diarrhea, abdominal pain. No hematemesis, melena or hematochezia. GENITOURINARY: No dysuria, frequency, hematuria. ENDOCRINE: No polyuria or nocturia. No heat or cold intolerance. HEMATOLOGY: No anemia, bruising, bleeding. INTEGUMENTARY: No rashes, ulcers,  lesions. MUSCULOSKELETAL: No pain, arthritis, swelling, gout. NEUROLOGIC: No numbness, tingling, weakness or ataxia. No seizure-type activity. PSYCHIATRIC: No anxiety, depression, insomnia.  PHYSICAL EXAMINATION: VITAL SIGNS: Blood pressure (!) 155/90, pulse 94, temperature 98 F (36.7 C), temperature source Oral, resp. rate 16, height 5' 5.5" (1.664 m), weight 54.4 kg (120 lb), SpO2 100 %.  GENERAL: 55 y.o.-year-old White male patient, well-developed, well-nourished lying in the bed in no acute distress.  Pleasant and cooperative, resting comfortably HEENT: Head atraumatic, normocephalic. Pupils equal, round, reactive to light and accommodation. No scleral icterus. Extraocular muscles intact. Oropharynx is clear. Mucus membranes moist. NECK: Supple, full range of motion. No JVD, no bruit heard. No cervical lymphadenopathy. CHEST: Normal breath sounds bilaterally. No wheezing, rales, rhonchi or crackles. No use of accessory muscles of respiration.  No reproducible chest wall tenderness.  CARDIOVASCULAR: S1, S2 normal. No murmurs, rubs, or gallops appreciated. Cap refill <2 seconds. ABDOMEN: Soft, nontender, nondistended. No rebound, guarding, rigidity. Normoactive bowel sounds present in all four quadrants. No organomegaly or mass. EXTREMITIES: Full range of motion. No pedal edema, cyanosis, or clubbing. NEUROLOGIC: Cranial nerves II through XII are grossly intact with no focal sensorimotor deficit. Muscle strength 5/5 in all extremities. Sensation intact. Gait not checked. PSYCHIATRIC: The patient is alert and oriented x 3. Normal affect, mood, thought content. SKIN: Warm, dry, and intact without obvious rash, lesion, or ulcer.  LABORATORY PANEL:  CBC  Recent Labs Lab 07/19/16 2246  WBC 7.7  HGB 15.8  HCT 45.2  PLT 246   ----------------------------------------------------------------------------------------------------------------- Chemistries  Recent Labs Lab 07/19/16 2246   NA 115*  K 3.6  CL 82*  CO2 22  GLUCOSE 90  BUN <5*  CREATININE 0.58*  CALCIUM 8.5*   ------------------------------------------------------------------------------------------------------------------ Cardiac Enzymes  Recent Labs Lab 07/19/16 2246  TROPONINI <0.03   ------------------------------------------------------------------------------------------------------------------  RADIOLOGY: Ct Head Wo Contrast  Result Date: 07/20/2016 CLINICAL DATA:  55 y/o  M; confusion. EXAM: CT HEAD WITHOUT CONTRAST TECHNIQUE: Contiguous axial images were obtained from the base of the skull through the vertex without intravenous contrast. COMPARISON:  06/18/2012 CT head. FINDINGS: Brain: No evidence of acute infarction, hemorrhage, hydrocephalus, extra-axial collection or mass lesion/mass effect. Stable nonspecific foci of hypoattenuation in subcortical and periventricular white matter compatible with mild chronic microvascular ischemic changes. Vascular: No hyperdense vessel or unexpected calcification. Skull: Normal. Negative for fracture or focal lesion. Sinuses/Orbits: Left maxillary sinus mucous retention cysts, otherwise normally aerated paranasal sinuses and mastoid air cells. Orbits are unremarkable. Other: None. IMPRESSION: 1. No acute intracranial abnormality is identified. 2. Stable mild chronic microvascular ischemic changes. Electronically Signed   By: Mitzi Hansen M.D.   On: 07/20/2016 01:41    EKG: Normal sinus rhythm at 92 bpm with normal axis and nonspecific ST-T wave changes.   IMPRESSION AND PLAN:  This is a 55 y.o. male with a history of  EtOH use disorder, bipolar, hypertension, seizures, hyponatremia now being admitted with: 1. Altered mental status, likely secondary to acute hyponatremia, likely secondary to alcohol use disorder-we'll admit with telemetry monitoring, IV normal saline, recheck BMP in a.m. 2. Alcohol use disorder-CIWA protocol and observe for  signs of withdrawal. 3. History of hypertension, not currently medicated-monitor 4. History of seizures,  secondary to hyponatremia-seizure precautions and aspiration precautions. Ativan when necessary.  Diet/Nutrition:  Heart healthy Fluids:  IV normal saline DVT Px: Lovenox, SCDs and early ambulation Code Status: Full  All the records are reviewed and case discussed with ED provider. Management plans discussed with the patient and/or family who express understanding and agree with plan of care.   TOTAL TIME TAKING CARE OF THIS PATIENT: 60 minutes.   Jahmad Petrich D.O. on 07/20/2016 at 6:10 AM Between 7am to 6pm - Pager - [phone] After 6pm go to [email or web address] - [email or web address] [email or web address] [facility] [place] [facility] Office [phone] CC: Primary care physician; No PCP Per Patient     Note: This dictation was prepared with Dragon dictation along with smaller phrase technology. Any transcriptional errors that result from this process are unintentional.

## 2016-07-20 NOTE — ED Provider Notes (Signed)
[facility] Emergency Department Provider Note   ____________________________________________   First MD Initiated Contact with Patient 07/19/16 2314     (approximate)  I have reviewed the triage vital signs and the nursing notes.   HISTORY  Chief Complaint Near Syncope    HPI Jacob Velez is a 55 y.o. male who comes into the hospital today with a syncopal versus a near syncopal episode. According to the police the patient went to the wrong house. He thought he was at his girlfriend's house. The people inside called the police as the patient was trying to sleep on her back porch as well as on their front porch. He reports that he was in the car and then came back out and went back to the porch. The police report that they tried to do a field sobriety test on the patient but he was unable to do so. They report his pupils were pinpoint and they sat him up against the car. He reports that although the patient was leaning against the car the patient slumped over and was very out of it. He couldn't follow extraocular muscles and could not stand on his own. The police report that the patient had been drinking but the patient denies that he was drinking. The police report that they're unsure if there is anything else that he may have been taking. The patient denies passing out. He states that [place]. He reports that he went degrees the family and then left and couldn't get into the house. 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 His story is very disjointed. The patient denies any chest pain or complaints at this time. He has no headache, no shortness of breath, no belly pain, no nausea or vomiting. He reports that he just wants to go get his car but according to the police and has been impounded. The police brought the  patient here for evaluation.   Past Medical History:  Diagnosis Date  . Alcohol abuse   . Bipolar 1 disorder (HCC)   . H/O staphylococcal infection Childhood   right leg. Chigger bite and scratched "with dirty nails"  . Hypertension   . Seizures (HCC) 2004   due to dehydration, low sodium     Patient Active Problem List   Diagnosis Date Noted  . Alcohol intoxication (HCC) 06/26/2015  . Suicidal ideation 06/26/2015  . Seizures (HCC) 06/26/2015  . Bipolar disorder (HCC) 06/26/2015  . Alcohol abuse 06/21/2015  . Macrocytosis without anemia 07/04/2014  . Essential hypertension, benign 07/03/2014  . Hyponatremia 07/03/2014  . Routine general medical examination at a health care facility 04/27/2013  . Tobacco use disorder 04/27/2013  . Tobacco abuse counseling 04/27/2013    Past Surgical History:  Procedure Laterality Date  . JOINT REPLACEMENT Right 2012   right hip replacement  . REPLACEMENT TOTAL HIP W/  RESURFACING IMPLANTS Right     Prior to Admission medications   Medication Sig Start Date End Date Taking? Authorizing Provider  clonazePAM (KLONOPIN) 0.5 MG tablet Take 0.5 mg by mouth 2 (two) times daily as needed for anxiety.   Yes Historical Provider, MD  chlordiazePOXIDE (LIBRIUM) 25 MG capsule 1-2 capsules every 8 hours for 48 hours 1 capsule every 8 hours for 48 hours  1 capsule every 12 hours for 48 hours 1  capsule daily thereafter 06/21/15   Sherlene Shams, MD    Allergies Review of patient's allergies indicates no known allergies.  Family History  Problem Relation Age of Onset  . Seizures Mother   . Stroke Father 65    2/2 chemicals exposed to during [place]. He was a Occupational hygienist  . Cancer Neg Hx     Social History Social History  Substance Use Topics  . Smoking status: Current Every Day Smoker    Packs/day: 0.50    Years: 20.00    Types: Cigarettes  . Smokeless tobacco: Never Used  . Alcohol use 0.0 oz/week    5 - 7 Cans of beer per week     Comment:  admits to drinking today    Review of Systems Constitutional: No fever/chills Eyes: No visual changes. ENT: No sore throat. Cardiovascular: Denies chest pain. Respiratory: Denies shortness of breath. Gastrointestinal: No abdominal pain.  No nausea, no vomiting.  No diarrhea.  No constipation. Genitourinary: Negative for dysuria. Musculoskeletal: Negative for back pain. Skin: Negative for rash. Neurological: Syncope versus presyncope  10-point ROS otherwise negative.  ____________________________________________   PHYSICAL EXAM:  VITAL SIGNS: ED Triage Vitals  Enc Vitals Group     BP 07/19/16 1201 129/72     Pulse Rate 07/19/16 1201 91     Resp 07/19/16 1201 16     Temp --      Temp Source 07/19/16 2224 Oral     SpO2 07/19/16 1201 97 %     Weight 07/19/16 2226 120 lb (54.4 kg)     Height 07/19/16 2226 5' 5.5" (1.664 m)     Head Circumference --      Peak Flow --      Pain Score 07/19/16 1201 Asleep     Pain Loc --      Pain Edu? --      Excl. in GC? --     Constitutional: Alert and oriented. Well appearing and in no acute distress. Eyes: Conjunctivae are normal. PERRL. EOMI. Head: Atraumatic. Nose: No congestion/rhinnorhea. Mouth/Throat: Mucous membranes are moist.  Oropharynx non-erythematous. Cardiovascular: Normal rate, regular rhythm. Grossly normal heart sounds.  Good peripheral circulation. Respiratory: Normal respiratory effort.  No retractions. Lungs CTAB. Gastrointestinal: Soft and nontender. No distention. Positive bowel sounds Musculoskeletal: No lower extremity tenderness nor edema.   Neurologic:  Normal speech and language. Cranial nerves II through XII are grossly intact with no focal motor or neuro deficits Skin:  Skin is warm, dry and intact.  Psychiatric: Mood and affect are normal.   ____________________________________________   LABS (all labs ordered are listed, but only abnormal results are displayed)  Labs Reviewed  BASIC METABOLIC  PANEL - Abnormal; Notable for the following:       Result Value   Sodium 115 (*)    Chloride 82 (*)    BUN <5 (*)    Creatinine, Ser 0.58 (*)    Calcium 8.5 (*)    All other components within normal limits  CBC - Abnormal; Notable for the following:    MCV 101.2 (*)    MCH 35.5 (*)    All other components within normal limits  URINALYSIS COMPLETEWITH MICROSCOPIC ([facility] ONLY) - Abnormal; Notable for the following:    Color, Urine STRAW (*)    APPearance CLEAR (*)    Ketones, ur TRACE (*)    Specific Gravity, Urine 1.002 (*)    Squamous Epithelial / LPF 0-5 (*)    All other components within  normal limits  ETHANOL - Abnormal; Notable for the following:    Alcohol, Ethyl (B) 187 (*)    All other components within normal limits  URINE DRUG SCREEN, QUALITATIVE ([facility] ONLY)  TROPONIN I   ____________________________________________  EKG  ED ECG REPORT I, Rebecka Apley, the attending physician, personally viewed and interpreted this ECG.   Date: 07/20/2016  EKG Time: 112  Rate: 92  Rhythm: normal sinus rhythm  Axis: normal  Intervals:none  ST&T Change: none  ____________________________________________  RADIOLOGY  CT head ____________________________________________   PROCEDURES  Procedure(s) performed: None  Procedures  Critical Care performed: No  ____________________________________________   INITIAL IMPRESSION / ASSESSMENT AND PLAN / ED COURSE  Pertinent labs & imaging results that were available during my care of the patient were reviewed by me and considered in my medical decision making (see chart for details).  This is a 55 year old male with a history of alcohol abuse who comes into the hospital today with syncopal versus near syncopal episode. The patient seems confused although he knows where he is and who he is as well as the date. The police are concerned that the patient may be intoxicated but he is not currently under arrest. Looking at the  patient's blood work he has an acutely lowered sodium level which may contribute to his confusion. We are still waiting the results of the troponin as well as the ethanol. I will also do a CT head on the patient given his confusion. The patient has no complaints at this time. I will give him a liter of normal saline and the patient will be reassessed.  Clinical Course  Value Comment By Time  CT Head Wo Contrast 1. No acute intracranial abnormality is identified. 2. Stable mild chronic microvascular ischemic changes.   Rebecka Apley, MD 10/16 [phone]   The patient will be admitted to the hospitalist service.  ____________________________________________   FINAL CLINICAL IMPRESSION(S) / ED DIAGNOSES  Final diagnoses:  Weakness  Hyponatremia  Alcoholic intoxication without complication (HCC)      NEW MEDICATIONS STARTED DURING THIS VISIT:  New Prescriptions   No medications on file     Note:  This document was prepared using Dragon voice recognition software and may include unintentional dictation errors.    Rebecka Apley, MD 07/20/16 [phone]

## 2016-07-21 DIAGNOSIS — F3181 Bipolar II disorder: Secondary | ICD-10-CM

## 2016-07-21 LAB — CBC
HCT: 44.1 % (ref 40.0–52.0)
Hemoglobin: 15.5 g/dL (ref 13.0–18.0)
MCH: 35.9 pg — ABNORMAL HIGH (ref 26.0–34.0)
MCHC: 35 g/dL (ref 32.0–36.0)
MCV: 102.4 fL — AB (ref 80.0–100.0)
PLATELETS: 203 10*3/uL (ref 150–440)
RBC: 4.31 MIL/uL — ABNORMAL LOW (ref 4.40–5.90)
RDW: 12.6 % (ref 11.5–14.5)
WBC: 5.9 10*3/uL (ref 3.8–10.6)

## 2016-07-21 LAB — BASIC METABOLIC PANEL
ANION GAP: 7 (ref 5–15)
BUN: 8 mg/dL (ref 6–20)
CALCIUM: 8.6 mg/dL — AB (ref 8.9–10.3)
CO2: 23 mmol/L (ref 22–32)
Chloride: 94 mmol/L — ABNORMAL LOW (ref 101–111)
Creatinine, Ser: 0.56 mg/dL — ABNORMAL LOW (ref 0.61–1.24)
GFR calc Af Amer: >60 mL/min (ref >60)
GLUCOSE: 84 mg/dL (ref 65–99)
Potassium: 3.9 mmol/L (ref 3.5–5.1)
SODIUM: 124 mmol/L — AB (ref 135–145)

## 2016-07-21 LAB — TSH: TSH: 2.057 u[IU]/mL (ref 0.350–4.500)

## 2016-07-21 LAB — SODIUM
SODIUM: 122 mmol/L — AB (ref 135–145)
Sodium: 126 mmol/L — ABNORMAL LOW (ref 135–145)
Sodium: 125 mmol/L — ABNORMAL LOW (ref 135–145)

## 2016-07-21 MED ORDER — DEXTROSE-NACL 5-0.9 % IV SOLN
INTRAVENOUS | Status: DC
  Administered 2016-07-21: 21:00:00 via INTRAVENOUS

## 2016-07-21 MED ORDER — SODIUM CHLORIDE 0.9 % IV SOLN
INTRAVENOUS | Status: DC
  Administered 2016-07-21: 11:00:00 via INTRAVENOUS

## 2016-07-21 NOTE — Progress Notes (Signed)
[facility] - [place] at [facility]   PATIENT NAME: Jacob LusterSteven Velez    MR#:  [id number]  DATE OF BIRTH:  [birth year]  SUBJECTIVE:   patientWants to go home today he says he needs pick up his car that the police confiscated due to elevated EtOH level  REVIEW OF SYSTEMS:    Review of Systems  Constitutional: Negative.  Negative for chills, fever and malaise/fatigue.  HENT: Negative.  Negative for ear discharge, ear pain, hearing loss, nosebleeds and sore throat.   Eyes: Negative.  Negative for blurred vision and pain.  Respiratory: Negative.  Negative for cough, hemoptysis, shortness of breath and wheezing.   Cardiovascular: Negative.  Negative for chest pain, palpitations and leg swelling.  Gastrointestinal: Negative.  Negative for abdominal pain, blood in stool, diarrhea, nausea and vomiting.  Genitourinary: Negative.  Negative for dysuria.  Musculoskeletal: Negative.  Negative for back pain.  Skin: Negative.   Neurological: Negative for dizziness, tremors, speech change, focal weakness, seizures and headaches.  Endo/Heme/Allergies: Negative.  Does not bruise/bleed easily.  Psychiatric/Behavioral: Negative.  Negative for depression, hallucinations and suicidal ideas.    Tolerating Diet: yes      DRUG ALLERGIES:  No Known Allergies  VITALS:  Blood pressure (!) 169/93, pulse 80, temperature 97.8 F (36.6 C), temperature source Oral, resp. rate 18, height 5' 5.5" (1.664 m), weight 54.4 kg (120 lb), SpO2 100 %.  PHYSICAL EXAMINATION:   Physical Exam  Constitutional: He is oriented to person, place, and time and well-developed, well-nourished, and in no distress. No distress.  HENT:  Head: Normocephalic.  Eyes: No scleral icterus.  Neck: Normal range of motion. Neck supple. No JVD present. No tracheal deviation present.  Cardiovascular: Normal rate, regular rhythm and normal heart sounds.  Exam reveals no gallop and no friction rub.   No murmur  heard. Pulmonary/Chest: Effort normal and breath sounds normal. No respiratory distress. He has no wheezes. He has no rales. He exhibits no tenderness.  Abdominal: Soft. Bowel sounds are normal. He exhibits no distension and no mass. There is no tenderness. There is no rebound and no guarding.  Musculoskeletal: Normal range of motion. He exhibits no edema.  Neurological: He is alert and oriented to person, place, and time.  Skin: Skin is warm. No rash noted. No erythema.  Psychiatric: Affect and judgment normal.      LABORATORY PANEL:   CBC  Recent Labs Lab 07/21/16 0703  WBC 5.9  HGB 15.5  HCT 44.1  PLT 203   ------------------------------------------------------------------------------------------------------------------  Chemistries   Recent Labs Lab 07/20/16 0627  07/21/16 0703  NA 125*  < > 124*  K 3.6  --  3.9  CL 95*  --  94*  CO2 20*  --  23  GLUCOSE 183*  --  84  BUN <5*  --  8  CREATININE 0.49*  --  0.56*  CALCIUM 7.9*  --  8.6*  MG 1.8  --   --   < > = values in this interval not displayed. ------------------------------------------------------------------------------------------------------------------  Cardiac Enzymes  Recent Labs Lab 07/19/16 2246  TROPONINI <0.03   ------------------------------------------------------------------------------------------------------------------  RADIOLOGY:  Ct Head Wo Contrast  Result Date: 07/20/2016 CLINICAL DATA:  55 y/o  M; confusion. EXAM: CT HEAD WITHOUT CONTRAST TECHNIQUE: Contiguous axial images were obtained from the base of the skull through the vertex without intravenous contrast. COMPARISON:  06/18/2012 CT head. FINDINGS: Brain: No evidence of acute infarction, hemorrhage, hydrocephalus, extra-axial collection or mass lesion/mass effect. Stable nonspecific  foci of hypoattenuation in subcortical and periventricular white matter compatible with mild chronic microvascular ischemic changes. Vascular: No  hyperdense vessel or unexpected calcification. Skull: Normal. Negative for fracture or focal lesion. Sinuses/Orbits: Left maxillary sinus mucous retention cysts, otherwise normally aerated paranasal sinuses and mastoid air cells. Orbits are unremarkable. Other: None. IMPRESSION: 1. No acute intracranial abnormality is identified. 2. Stable mild chronic microvascular ischemic changes. Electronically Signed   By: Mitzi Hansen M.D.   On: 07/20/2016 01:41     ASSESSMENT AND PLAN:   55 year old male with a history of EtOH abuse and bipolar disease who presents with near syncope and found to have hyponatremia.   1. Acute metabolic encephalopathy due to hyponatremia:  Continue to monitor sodium level. Restart IV fluids. Patient has chronically low sodium levels about 127-129.  2. EtOH abuse:  uneventful detox thus far Continue CIWA protocol  3. History of bipolar affective disorder: Psychiatry consult pending   Rounded with nursing staff   Management plans discussed with the patient and he is in agreement.  CODE STATUS: full  TOTAL TIME TAKING CARE OF THIS PATIENT: 27 minutes.     POSSIBLE D/C tomorrow, DEPENDING ON CLINICAL CONDITION.   Ronal Maybury M.D on 07/21/2016 at 11:22 AM  Between 7am to 6pm - Pager - [phone] After 6pm go to [email or web address] - Social research officer, government  [facility]  Office  [phone]  CC: Primary care physician; No PCP Per Patient  Note: This dictation was prepared with Dragon dictation along with smaller phrase technology. Any transcriptional errors that result from this process are unintentional.

## 2016-07-21 NOTE — Progress Notes (Signed)
Pt alert and oriented. Eat and drank most of the time when awake. Ativan given once per CIWA protocol. Up in room during the night.

## 2016-07-21 NOTE — Consult Note (Signed)
[facility] Face-to-Face Psychiatry Consult   Reason for Consult:  Consult for 55 year old man with a history of alcohol abuse and bipolar disorder with hypomania Referring Physician:  Mody Patient Identification: Jacob Velez MRN:  [id number] Principal Diagnosis: Bipolar 2 disorder [facility]) Diagnosis:   Patient Active Problem List   Diagnosis Date Noted  . Bipolar 2 disorder ([place]) [F31.81] 07/21/2016  . Alcohol intoxication ([place]) [F10.929] 06/26/2015  . Suicidal ideation [R45.851] 06/26/2015  . Seizures ([place]) [R56.9] 06/26/2015  . Bipolar disorder ([place]) [F31.9] 06/26/2015  . Alcohol abuse [F10.10] 06/21/2015  . Macrocytosis without anemia [D75.89] 07/04/2014  . Essential hypertension, benign [I10] 07/03/2014  . Hyponatremia [E87.1] 07/03/2014  . Routine general medical examination at a health care facility [Z00.00] 04/27/2013  . Tobacco use disorder [F17.200] 04/27/2013  . Tobacco abuse counseling [Z71.6] 04/27/2013    Total Time spent with patient: 1 hour  Subjective:   Jacob Velez is a 55 y.o. male patient admitted with "I've just been so busy".  HPI:  Patient seen. Chart reviewed. Labs reviewed notes reviewed. Spoke with hospitalist. Patient known to me from several prior encounters. This 55 year old man was brought into the hospital with "syncope" probably related to alcohol abuse and hyponatremia. He is being treated for his hyponatremia primarily at this point. Consult about his history of bipolar disorder. Patient was quite chatty as usual but also as usual a little evasive about details. He claims that he has been living with his mother and caring for her every little need for the last couple weeks which has meant that he has slept only about 2 hours a night. He says he had been eating poorly and thinks he was perhaps drinking too much water. He minimizes his alcohol use and insists that he is not abusing alcohol. He says his mood has been a little nervous but not depressed and not  bad. Denies any psychotic symptoms denies any suicidal or homicidal thoughts. He is not currently going for any kind of mental health treatment. He says that at night that he came in to the hospital he was just very very tired which is why he missed took a different house in the neighborhood for the house of his fiance.  Social history: Had been living with his mother according to him. Says that he is going to be going to live with his fiance in the future. This is one of the things on which she can be very evasive and hard to pin down. He also mentions several different jobs that he may or may not be working currently most prominently saying that he is working at [employer].  Medical history: Patient presented with hyponatremia. Not clear exactly what the cause of this is. He is saying to me now that he was drinking a lot of water. That would certainly be possible but it also could be that he picked up on that as being an explanation. He reports that he is not on any prescription medicines currently.  Substance abuse history: History of clear abuse of alcohol although he always denies or minimizes it. Not using other drugs.  Past Psychiatric History: Patient has been seen several prior times. He always presents with this same mental status of being hyperverbal somewhat disorganized in his thinking but not actually psychotic bizarre or delusional. He is hypomanic chronically as far as I can tell. We have tried treating him with medication before in the form of mood stabilizers and he will not follow up with that. He does  not see himself as having a problem with his mood. No history of suicide attempts or actual violence in the past.  Risk to Self: Is patient at risk for suicide?: No Risk to Others:   Prior Inpatient Therapy:   Prior Outpatient Therapy:    Past Medical History:  Past Medical History:  Diagnosis Date  . Alcohol abuse   . Bipolar 1 disorder ([place])   . H/O staphylococcal  infection Childhood   right leg. Chigger bite and scratched "with dirty nails"  . Hypertension   . Seizures ([place]) 2004   due to dehydration, low sodium     Past Surgical History:  Procedure Laterality Date  . JOINT REPLACEMENT Right 2012   right hip replacement  . REPLACEMENT TOTAL HIP W/  RESURFACING IMPLANTS Right    Family History:  Family History  Problem Relation Age of Onset  . Seizures Mother   . Stroke Father 46    2/2 chemicals exposed to during [place]. He was a Insurance underwriter  . Cancer Neg Hx    Family Psychiatric  History: This is also an area it's hard to pin him down on. He does not say he knows of any family history Social History:  History  Alcohol Use  . 0.0 oz/week  . 5 - 7 Cans of beer per week    Comment: admits to drinking today     History  Drug Use No    Social History   Social History  . Marital status: Divorced    Spouse name: N/A  . Number of children: 2  . Years of education: 16   Occupational History  . [place] History Main Topics  . Smoking status: Current Every Day Smoker    Packs/day: 0.50    Years: 20.00    Types: Cigarettes  . Smokeless tobacco: Never Used  . Alcohol use 0.0 oz/week    5 - 7 Cans of beer per week     Comment: admits to drinking today  . Drug use: No  . Sexual activity: Not Asked   Other Topics Concern  . None   Social History Narrative   Patient was born in [place]. His father was a Nature conservation officer man. They later moved to [facility] and was there until age 59. His father was then transferred to [place], [place] and later to [place], [place] to be the area [employer]. Patient attended [employer] and obtained his Bachelors in Forensic psychologist. He recently moved back to the area after living years in [place]. He is divorced and has a son and daughter. He lives at home by himself. He moved to the area to be closer to his elderly parents. Both of his children are away in college. One is at  [employer] and the other at [employer]. Patient enjoys playing golf, going to baseball games, fishing, etc. He enjoys the outdoors.   Additional Social History:    Allergies:  No Known Allergies  Labs:  Results for orders placed or performed during the hospital encounter of 07/19/16 (from the past 48 hour(s))  Basic metabolic panel     Status: Abnormal   Collection Time: 07/19/16 10:46 PM  Result Value Ref Range   Sodium 115 (LL) 135 - 145 mmol/L    Comment: CRITICAL RESULT CALLED TO, READ BACK BY AND VERIFIED WITH ANDREA BRYANT AT 2313 ON 07/19/16 RWW    Potassium 3.6 3.5 - 5.1 mmol/L   Chloride 82 (L) 101 -  111 mmol/L   CO2 22 22 - 32 mmol/L   Glucose, Bld 90 65 - 99 mg/dL   BUN <5 (L) 6 - 20 mg/dL   Creatinine, Ser 0.58 (L) 0.61 - 1.24 mg/dL   Calcium 8.5 (L) 8.9 - 10.3 mg/dL   GFR calc non Af Amer >60 >60 mL/min   GFR calc Af Amer >60 >60 mL/min    Comment: (NOTE) The eGFR has been calculated using the CKD EPI equation. This calculation has not been validated in all clinical situations. eGFR's persistently <60 mL/min signify possible Chronic Kidney Disease.    Anion gap 11 5 - 15  CBC     Status: Abnormal   Collection Time: 07/19/16 10:46 PM  Result Value Ref Range   WBC 7.7 3.8 - 10.6 K/uL   RBC 4.46 4.40 - 5.90 MIL/uL   Hemoglobin 15.8 13.0 - 18.0 g/dL   HCT 45.2 40.0 - 52.0 %   MCV 101.2 (H) 80.0 - 100.0 fL   MCH 35.5 (H) 26.0 - 34.0 pg   MCHC 35.0 32.0 - 36.0 g/dL   RDW 12.0 11.5 - 14.5 %   Platelets 246 150 - 440 K/uL  Ethanol     Status: Abnormal   Collection Time: 07/19/16 10:46 PM  Result Value Ref Range   Alcohol, Ethyl (B) 187 (H) <5 mg/dL    Comment:        LOWEST DETECTABLE LIMIT FOR SERUM ALCOHOL IS 5 mg/dL FOR MEDICAL PURPOSES ONLY   Troponin I     Status: None   Collection Time: 07/19/16 10:46 PM  Result Value Ref Range   Troponin I <0.03 <0.03 ng/mL  Urinalysis complete, with microscopic     Status: Abnormal   Collection Time: 07/19/16 10:47 PM   Result Value Ref Range   Color, Urine STRAW (A) YELLOW   APPearance CLEAR (A) CLEAR   Glucose, UA NEGATIVE NEGATIVE mg/dL   Bilirubin Urine NEGATIVE NEGATIVE   Ketones, ur TRACE (A) NEGATIVE mg/dL   Specific Gravity, Urine 1.002 (L) 1.005 - 1.030   Hgb urine dipstick NEGATIVE NEGATIVE   pH 6.0 5.0 - 8.0   Protein, ur NEGATIVE NEGATIVE mg/dL   Nitrite NEGATIVE NEGATIVE   Leukocytes, UA NEGATIVE NEGATIVE   RBC / HPF NONE SEEN 0 - 5 RBC/hpf   WBC, UA NONE SEEN 0 - 5 WBC/hpf   Bacteria, UA NONE SEEN NONE SEEN   Squamous Epithelial / LPF 0-5 (A) NONE SEEN  Urine Drug Screen, Qualitative ([facility] only)     Status: None   Collection Time: 07/19/16 10:47 PM  Result Value Ref Range   Tricyclic, Ur Screen NONE DETECTED NONE DETECTED   Amphetamines, Ur Screen NONE DETECTED NONE DETECTED   MDMA (Ecstasy)Ur Screen NONE DETECTED NONE DETECTED   Cocaine Metabolite,Ur Butts NONE DETECTED NONE DETECTED   Opiate, Ur Screen NONE DETECTED NONE DETECTED   Phencyclidine (PCP) Ur S NONE DETECTED NONE DETECTED   Cannabinoid 50 Ng, Ur  NONE DETECTED NONE DETECTED   Barbiturates, Ur Screen NONE DETECTED NONE DETECTED   Benzodiazepine, Ur Scrn NONE DETECTED NONE DETECTED   Methadone Scn, Ur NONE DETECTED NONE DETECTED    Comment: (NOTE) 915  Tricyclics, urine               Cutoff 1000 ng/mL 200  Amphetamines, urine             Cutoff 1000 ng/mL 300  MDMA (Ecstasy), urine  Cutoff 500 ng/mL 400  Cocaine Metabolite, urine       Cutoff 300 ng/mL 500  Opiate, urine                   Cutoff 300 ng/mL 600  Phencyclidine (PCP), urine      Cutoff 25 ng/mL 700  Cannabinoid, urine              Cutoff 50 ng/mL 800  Barbiturates, urine             Cutoff 200 ng/mL 900  Benzodiazepine, urine           Cutoff 200 ng/mL 1000 Methadone, urine                Cutoff 300 ng/mL 1100 1200 The urine drug screen provides only a preliminary, unconfirmed 1300 analytical test result and should not be used for  non-medical 1400 purposes. Clinical consideration and professional judgment should 1500 be applied to any positive drug screen result due to possible 1600 interfering substances. A more specific alternate chemical method 1700 must be used in order to obtain a confirmed analytical result.  1800 Gas chromato graphy / mass spectrometry (GC/MS) is the preferred 1900 confirmatory method.   CBC     Status: Abnormal   Collection Time: 07/20/16  6:27 AM  Result Value Ref Range   WBC 3.8 3.8 - 10.6 K/uL   RBC 4.07 (L) 4.40 - 5.90 MIL/uL   Hemoglobin 14.9 13.0 - 18.0 g/dL    Comment: RESULT REPEATED AND VERIFIED   HCT 40.7 40.0 - 52.0 %    Comment: RESULT REPEATED AND VERIFIED   MCV 100.1 (H) 80.0 - 100.0 fL   MCH 36.6 (H) 26.0 - 34.0 pg   MCHC 36.6 (H) 32.0 - 36.0 g/dL   RDW 12.4 11.5 - 14.5 %   Platelets 192 150 - 440 K/uL  Magnesium     Status: None   Collection Time: 07/20/16  6:27 AM  Result Value Ref Range   Magnesium 1.8 1.7 - 2.4 mg/dL  Phosphorus     Status: None   Collection Time: 07/20/16  6:27 AM  Result Value Ref Range   Phosphorus 2.8 2.5 - 4.6 mg/dL  Basic metabolic panel     Status: Abnormal   Collection Time: 07/20/16  6:27 AM  Result Value Ref Range   Sodium 125 (L) 135 - 145 mmol/L   Potassium 3.6 3.5 - 5.1 mmol/L   Chloride 95 (L) 101 - 111 mmol/L   CO2 20 (L) 22 - 32 mmol/L   Glucose, Bld 183 (H) 65 - 99 mg/dL   BUN <5 (L) 6 - 20 mg/dL   Creatinine, Ser 0.49 (L) 0.61 - 1.24 mg/dL   Calcium 7.9 (L) 8.9 - 10.3 mg/dL   GFR calc non Af Amer >60 >60 mL/min   GFR calc Af Amer >60 >60 mL/min    Comment: (NOTE) The eGFR has been calculated using the CKD EPI equation. This calculation has not been validated in all clinical situations. eGFR's persistently <60 mL/min signify possible Chronic Kidney Disease.    Anion gap 10 5 - 15  Sodium     Status: Abnormal   Collection Time: 07/20/16  8:04 AM  Result Value Ref Range   Sodium 125 (L) 135 - 145 mmol/L  Sodium      Status: Abnormal   Collection Time: 07/20/16  1:32 PM  Result Value Ref Range   Sodium 126 (L) 135 -  145 mmol/L  Sodium     Status: Abnormal   Collection Time: 07/20/16  7:37 PM  Result Value Ref Range   Sodium 123 (L) 135 - 145 mmol/L  Sodium     Status: Abnormal   Collection Time: 07/21/16  1:53 AM  Result Value Ref Range   Sodium 126 (L) 135 - 145 mmol/L  CBC     Status: Abnormal   Collection Time: 07/21/16  7:03 AM  Result Value Ref Range   WBC 5.9 3.8 - 10.6 K/uL   RBC 4.31 (L) 4.40 - 5.90 MIL/uL   Hemoglobin 15.5 13.0 - 18.0 g/dL   HCT 44.1 40.0 - 52.0 %   MCV 102.4 (H) 80.0 - 100.0 fL   MCH 35.9 (H) 26.0 - 34.0 pg   MCHC 35.0 32.0 - 36.0 g/dL   RDW 12.6 11.5 - 14.5 %   Platelets 203 150 - 440 K/uL  Basic metabolic panel     Status: Abnormal   Collection Time: 07/21/16  7:03 AM  Result Value Ref Range   Sodium 124 (L) 135 - 145 mmol/L   Potassium 3.9 3.5 - 5.1 mmol/L   Chloride 94 (L) 101 - 111 mmol/L   CO2 23 22 - 32 mmol/L   Glucose, Bld 84 65 - 99 mg/dL   BUN 8 6 - 20 mg/dL   Creatinine, Ser 0.56 (L) 0.61 - 1.24 mg/dL   Calcium 8.6 (L) 8.9 - 10.3 mg/dL   GFR calc non Af Amer >60 >60 mL/min   GFR calc Af Amer >60 >60 mL/min    Comment: (NOTE) The eGFR has been calculated using the CKD EPI equation. This calculation has not been validated in all clinical situations. eGFR's persistently <60 mL/min signify possible Chronic Kidney Disease.    Anion gap 7 5 - 15  TSH     Status: None   Collection Time: 07/21/16  7:03 AM  Result Value Ref Range   TSH 2.057 0.350 - 4.500 uIU/mL    Comment: Performed by a 3rd Generation assay with a functional sensitivity of <=0.01 uIU/mL.  Sodium     Status: Abnormal   Collection Time: 07/21/16 12:58 PM  Result Value Ref Range   Sodium 122 (L) 135 - 145 mmol/L    Current Facility-Administered Medications  Medication Dose Route Frequency Provider Last Rate Last Dose  . acetaminophen (TYLENOL) tablet 650 mg  650 mg Oral Q6H  PRN Alexis Hugelmeyer, DO       Or  . acetaminophen (TYLENOL) suppository 650 mg  650 mg Rectal Q6H PRN Alexis Hugelmeyer, DO      . bisacodyl (DULCOLAX) EC tablet 5 mg  5 mg Oral Daily PRN Alexis Hugelmeyer, DO      . dextrose 5 %-0.9 % sodium chloride infusion   Intravenous Continuous Munsoor Lateef, MD      . enoxaparin (LOVENOX) injection 40 mg  40 mg Subcutaneous Q24H Alexis Hugelmeyer, DO   40 mg at 07/21/16 0641  . folic acid (FOLVITE) tablet 1 mg  1 mg Oral Daily Alexis Hugelmeyer, DO   1 mg at 07/21/16 1051  . HYDROcodone-acetaminophen (NORCO/VICODIN) 5-325 MG per tablet 1-2 tablet  1-2 tablet Oral Q4H PRN Alexis Hugelmeyer, DO      . LORazepam (ATIVAN) tablet 1 mg  1 mg Oral Q6H PRN Alexis Hugelmeyer, DO   1 mg at 07/20/16 1544   Or  . LORazepam (ATIVAN) injection 1 mg  1 mg Intravenous Q6H PRN [employer], DO  1 mg at 07/21/16 0053  . magnesium citrate solution 1 Bottle  1 Bottle Oral Once PRN Alexis Hugelmeyer, DO      . multivitamin with minerals tablet 1 tablet  1 tablet Oral Daily Alexis Hugelmeyer, DO   1 tablet at 07/21/16 1051  . ondansetron (ZOFRAN) tablet 4 mg  4 mg Oral Q6H PRN Alexis Hugelmeyer, DO       Or  . ondansetron (ZOFRAN) injection 4 mg  4 mg Intravenous Q6H PRN Alexis Hugelmeyer, DO      . senna-docusate (Senokot-S) tablet 1 tablet  1 tablet Oral QHS PRN Alexis Hugelmeyer, DO      . sodium chloride flush (NS) 0.9 % injection 3 mL  3 mL Intravenous Q12H Alexis Hugelmeyer, DO   3 mL at 07/20/16 1000  . thiamine (VITAMIN B-1) tablet 100 mg  100 mg Oral Daily Alexis Hugelmeyer, DO   100 mg at 07/21/16 1051   Or  . thiamine (B-1) injection 100 mg  100 mg Intravenous Daily Alexis Hugelmeyer, DO      . zolpidem (AMBIEN) tablet 5 mg  5 mg Oral QHS PRN,MR X 1 Alexis Hugelmeyer, DO        Musculoskeletal: Strength & Muscle Tone: within normal limits Gait & Station: normal Patient leans: N/A  Psychiatric Specialty Exam: Physical Exam  Nursing note and vitals  reviewed. Constitutional: He appears well-developed and well-nourished.  HENT:  Head: Normocephalic and atraumatic.  Eyes: Conjunctivae are normal. Pupils are equal, round, and reactive to light.  Neck: Normal range of motion.  Cardiovascular: Regular rhythm and normal heart sounds.   Respiratory: Effort normal. No respiratory distress.  GI: Soft.  Musculoskeletal: Normal range of motion.  Neurological: He is alert.  Skin: Skin is warm and dry.  Psychiatric: His behavior is normal. His mood appears anxious. His speech is rapid and/or pressured. Thought content is not delusional. He expresses impulsivity. He expresses no homicidal and no suicidal ideation. He exhibits abnormal recent memory.    Review of Systems  Constitutional: Negative.   HENT: Negative.   Eyes: Negative.   Respiratory: Negative.   Cardiovascular: Negative.   Gastrointestinal: Negative.   Musculoskeletal: Negative.   Skin: Negative.   Neurological: Negative.   Psychiatric/Behavioral: Positive for memory loss. Negative for depression, hallucinations, substance abuse and suicidal ideas. The patient is nervous/anxious and has insomnia.     Blood pressure (!) 142/87, pulse 77, temperature 97.8 F (36.6 C), temperature source Oral, resp. rate 20, height 5' 5.5" (1.664 m), weight 54.4 kg (120 lb), SpO2 100 %.Body mass index is 19.67 kg/m.  General Appearance: Casual  Eye Contact:  Fair  Speech:  Pressured  Volume:  Increased  Mood:  Anxious  Affect:  Labile  Thought Process:  Disorganized  Orientation:  Full (Time, Place, and Person)  Thought Content:  Tangential  Suicidal Thoughts:  No  Homicidal Thoughts:  No  Memory:  Immediate;   Good Recent;   Fair Remote;   Poor  Judgement:  Impaired  Insight:  Shallow  Psychomotor Activity:  Restlessness  Concentration:  Concentration: Poor  Recall:  [employer] of Knowledge:  Fair  Language:  Fair  Akathisia:  No  Handed:  Right  AIMS (if indicated):     Assets:   Communication Skills Resilience  ADL's:  Intact  Cognition:  WNL  Sleep:        Treatment Plan Summary: Plan This is a 55 year old man with chronic hypomania and alcohol abuse. No insight  as usual. He will insists that he does not have a drinking problem no matter how much evidence 1 brings to bear. He strongly resists getting involved in any kind of treatment. Not clear that he needs her would benefit from any psychiatric medication. He is not psychotic and I don't think that he is dangerous from his behavior. Long-term history suggests that this is not likely to turn into any kind of psychotic mania and he does not have a known history of psychotic depression. Supportive counseling and tried to give him some education about alcohol abuse and encourage him to take better care of his health. No other psychiatric intervention at this point. I will follow-up as needed.  Disposition: No evidence of imminent risk to self or others at present.   Patient does not meet criteria for psychiatric inpatient admission.  Alethia Berthold, MD 07/21/2016 5:11 PM

## 2016-07-21 NOTE — Care Management (Signed)
Patient has received some clothing from team here at [facility] (pants and jacket).

## 2016-07-21 NOTE — Consult Note (Signed)
[facility] CONSULT NOTE    Date: 07/21/2016                  Patient Name:  [place]  MRN: [id number]  DOB: 06-16-1961  Age / Sex: 55 y.o., male         PCP: No PCP Per Patient                 Service Requesting Consult: Hospitalist.                 Reason for Consult: hyponatremia            History of Present Illness: Patient is a 55 y.o. male with a PMHx of alcohol abuse, bipolar disorder, hypertension, history of seizure disorder who was admitted to [facility] on 07/19/2016 for evaluation of near syncopal event.  The patient has history of alcohol abuse.  It appears that he was trying to find his girlfriend's home and was knocking on other residents home near his girlfriend's home.  It appears that the police was subsequently called.  They attempted to do feel sobriety test however patient was unable to stand.  He was subsequently brought here.  He was found to have hyponatremia with initial serum sodium of 1:15.  Serum sodium thereafter was 125.  Today serum sodium is down to 122.  It appears that he has long-standing chronic hyponatremia.  His normal serum sodium is usually in the mid to upper 120s.  Patient was found to have alcohol in his system yesterday.   Medications: Outpatient medications: Facility-Administered Medications Prior to Admission  Medication Dose Route Frequency Provider Last Rate Last Dose  . TDaP (BOOSTRIX) injection 0.5 mL  0.5 mL Intramuscular Once Raquel Conni Elliot, NP       Prescriptions Prior to Admission  Medication Sig Dispense Refill Last Dose  . clonazePAM (KLONOPIN) 0.5 MG tablet Take 0.5 mg by mouth 2 (two) times daily as needed for anxiety.     . chlordiazePOXIDE (LIBRIUM) 25 MG capsule 1-2 capsules every 8 hours for 48 hours 1 capsule every 8 hours for 48 hours  1 capsule every 12 hours for 48 hours 1 capsule daily thereafter 30 capsule 0 unknown at unknown    Current medications: Current Facility-Administered  Medications  Medication Dose Route Frequency Provider Last Rate Last Dose  . 0.9 %  sodium chloride infusion   Intravenous Continuous Adrian Saran, MD 75 mL/hr at 07/21/16 1051    . acetaminophen (TYLENOL) tablet 650 mg  650 mg Oral Q6H PRN Alexis Hugelmeyer, DO       Or  . acetaminophen (TYLENOL) suppository 650 mg  650 mg Rectal Q6H PRN Alexis Hugelmeyer, DO      . bisacodyl (DULCOLAX) EC tablet 5 mg  5 mg Oral Daily PRN Alexis Hugelmeyer, DO      . enoxaparin (LOVENOX) injection 40 mg  40 mg Subcutaneous Q24H Alexis Hugelmeyer, DO   40 mg at 07/21/16 0641  . folic acid (FOLVITE) tablet 1 mg  1 mg Oral Daily Alexis Hugelmeyer, DO   1 mg at 07/21/16 1051  . HYDROcodone-acetaminophen (NORCO/VICODIN) 5-325 MG per tablet 1-2 tablet  1-2 tablet Oral Q4H PRN Alexis Hugelmeyer, DO      . LORazepam (ATIVAN) tablet 1 mg  1 mg Oral Q6H PRN Alexis Hugelmeyer, DO   1 mg at 07/20/16 1544   Or  . LORazepam (ATIVAN) injection 1 mg  1 mg Intravenous Q6H PRN [employer], DO  1 mg at 07/21/16 0053  . magnesium citrate solution 1 Bottle  1 Bottle Oral Once PRN Alexis Hugelmeyer, DO      . multivitamin with minerals tablet 1 tablet  1 tablet Oral Daily Alexis Hugelmeyer, DO   1 tablet at 07/21/16 1051  . ondansetron (ZOFRAN) tablet 4 mg  4 mg Oral Q6H PRN Alexis Hugelmeyer, DO       Or  . ondansetron (ZOFRAN) injection 4 mg  4 mg Intravenous Q6H PRN Alexis Hugelmeyer, DO      . senna-docusate (Senokot-S) tablet 1 tablet  1 tablet Oral QHS PRN Alexis Hugelmeyer, DO      . sodium chloride flush (NS) 0.9 % injection 3 mL  3 mL Intravenous Q12H Alexis Hugelmeyer, DO   3 mL at 07/20/16 1000  . thiamine (VITAMIN B-1) tablet 100 mg  100 mg Oral Daily Alexis Hugelmeyer, DO   100 mg at 07/21/16 1051   Or  . thiamine (B-1) injection 100 mg  100 mg Intravenous Daily Alexis Hugelmeyer, DO      . zolpidem (AMBIEN) tablet 5 mg  5 mg Oral QHS PRN,MR X 1 Alexis Hugelmeyer, DO          Allergies: No Known Allergies     Past Medical History: Past Medical History:  Diagnosis Date  . Alcohol abuse   . Bipolar 1 disorder (HCC)   . H/O staphylococcal infection Childhood   right leg. Chigger bite and scratched "with dirty nails"  . Hypertension   . Seizures (HCC) 2004   due to dehydration, low sodium      Past Surgical History: Past Surgical History:  Procedure Laterality Date  . JOINT REPLACEMENT Right 2012   right hip replacement  . REPLACEMENT TOTAL HIP W/  RESURFACING IMPLANTS Right      Family History: Family History  Problem Relation Age of Onset  . Seizures Mother   . Stroke Father 75    2/2 chemicals exposed to during [place]. He was a Occupational hygienist  . Cancer Neg Hx      Social History: Social History   Social History  . Marital status: Divorced    Spouse name: N/A  . Number of children: 2  . Years of education: 16   Occupational History  . [employer]   Social History Main Topics  . Smoking status: Current Every Day Smoker    Packs/day: 0.50    Years: 20.00    Types: Cigarettes  . Smokeless tobacco: Never Used  . Alcohol use 0.0 oz/week    5 - 7 Cans of beer per week     Comment: admits to drinking today  . Drug use: No  . Sexual activity: Not on file   Other Topics Concern  . Not on file   Social History Narrative   Patient was born in [place]. His father was a Hotel manager man. They later moved to [facility] and was there until age 47. His father was then transferred to [place], [place] and later to [place], [place] to be the area [employer]. 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 He recently moved back to the area after living years in [place]. He is divorced and has a son and daughter. He lives at home by himself. He moved to the area to be closer to his elderly parents. Both of his children are away in college. One is at [employer] and the other at [employer]. Patient enjoys playing golf,  going to baseball games,  fishing, etc. He enjoys the outdoors.     Review of Systems: Patient unable to fully concentrate on review of systems questions  Vital Signs: Blood pressure (!) 142/87, pulse 77, temperature 97.8 F (36.6 C), temperature source Oral, resp. rate 20, height 5' 5.5" (1.664 m), weight 54.4 kg (120 lb), SpO2 100 %.  Weight trends: Filed Weights   07/19/16 2226  Weight: 54.4 kg (120 lb)    Physical Exam: General: NAD, resting in bed  Head: Normocephalic, atraumatic.  Eyes: Anicteric, EOMI  Nose: Mucous membranes moist, not inflammed, nonerythematous.  Throat: Oropharynx nonerythematous, no exudate appreciated.   Neck: Supple, trachea midline.  Lungs:  Normal respiratory effort. Clear to auscultation BL without crackles or wheezes.  Heart: RRR. S1 and S2 normal without gallop, murmur, or rubs.  Abdomen:  BS normoactive. Soft, Nondistended, non-tender.  No masses or organomegaly.  Extremities: No pretibial edema.  Neurologic: Awake, alert, unable to concentrate on questions posed, tangential answers  Skin: No visible rashes, scars.    Lab results: Basic Metabolic Panel:  Recent Labs Lab 07/19/16 2246 07/20/16 0627  07/21/16 0153 07/21/16 0703 07/21/16 1258  NA 115* 125*  < > 126* 124* 122*  K 3.6 3.6  --   --  3.9  --   CL 82* 95*  --   --  94*  --   CO2 22 20*  --   --  23  --   GLUCOSE 90 183*  --   --  84  --   BUN <5* <5*  --   --  8  --   CREATININE 0.58* 0.49*  --   --  0.56*  --   CALCIUM 8.5* 7.9*  --   --  8.6*  --   MG  --  1.8  --   --   --   --   PHOS  --  2.8  --   --   --   --   < > = values in this interval not displayed.  Liver Function Tests: No results for input(s): AST, ALT, ALKPHOS, BILITOT, PROT, ALBUMIN in the last 168 hours. No results for input(s): LIPASE, AMYLASE in the last 168 hours. No results for input(s): AMMONIA in the last 168 hours.  CBC:  Recent Labs Lab 07/19/16 2246 07/20/16 0627 07/21/16 0703  WBC 7.7 3.8 5.9  HGB 15.8  14.9 15.5  HCT 45.2 40.7 44.1  MCV 101.2* 100.1* 102.4*  PLT 246 192 203    Cardiac Enzymes:  Recent Labs Lab 07/19/16 2246  TROPONINI <0.03    BNP: Invalid input(s): POCBNP  CBG: No results for input(s): GLUCAP in the last 168 hours.  Microbiology: No results found for this or any previous visit.  Coagulation Studies: No results for input(s): LABPROT, INR in the last 72 hours.  Urinalysis:  Recent Labs  07/19/16 2247  COLORURINE STRAW*  LABSPEC 1.002*  PHURINE 6.0  GLUCOSEU NEGATIVE  HGBUR NEGATIVE  BILIRUBINUR NEGATIVE  KETONESUR TRACE*  PROTEINUR NEGATIVE  NITRITE NEGATIVE  LEUKOCYTESUR NEGATIVE      Imaging: Ct Head Wo Contrast  Result Date: 07/20/2016 CLINICAL DATA:  55 y/o  M; confusion. EXAM: CT HEAD WITHOUT CONTRAST TECHNIQUE: Contiguous axial images were obtained from the base of the skull through the vertex without intravenous contrast. COMPARISON:  06/18/2012 CT head. FINDINGS: Brain: No evidence of acute infarction, hemorrhage, hydrocephalus, extra-axial collection or mass lesion/mass effect. Stable nonspecific foci of hypoattenuation in subcortical  and periventricular white matter compatible with mild chronic microvascular ischemic changes. Vascular: No hyperdense vessel or unexpected calcification. Skull: Normal. Negative for fracture or focal lesion. Sinuses/Orbits: Left maxillary sinus mucous retention cysts, otherwise normally aerated paranasal sinuses and mastoid air cells. Orbits are unremarkable. Other: None. IMPRESSION: 1. No acute intracranial abnormality is identified. 2. Stable mild chronic microvascular ischemic changes. Electronically Signed   By: Mitzi Hansen M.D.   On: 07/20/2016 01:41      Assessment & Plan: Pt is a 55 y.o. male with a PMHx of alcohol abuse, bipolar disorder, hypertension, history of seizure disorder who was admitted to [facility] on 07/19/2016 for evaluation of near syncopal event.   1.  Hyponatremia likley  secondary to ETOH abuse and tea and toast type diet.  2.  Altered mental status. 3.  ETOH abuse.   Plan:  We are consulted for the evaluation management of hyponatremia.  We suspect that hyponatremia is likely related to poor solute diet in the setting of alcohol abuse.  Unclear if he has underlying cirrhosis.  We will check an abdominal ultrasound to look for this.  The patient has chronic hyponatremia with an acute exacerbation now.   We will discontinue current fluids and transitioning to D5 normal saline at 100 cc per hour.  The patient is already receiving thiamine and folic acid.  We will continue to monitor serum sodium over the course of the hospitalization with you.

## 2016-07-21 NOTE — Consult Note (Signed)
  Psychiatry: Patient's consult was received. Spoke with hospitalist. Chart reviewed. I came to see the patient for the consult this morning but he would not get off of a telephone call and showed no signs of stopping after several minutes. I will try to follow-up again this afternoon.

## 2016-07-22 LAB — SODIUM: SODIUM: 127 mmol/L — AB (ref 135–145)

## 2016-07-22 MED ORDER — M.V.I. ADULT IV INJ
INJECTION | Freq: Once | INTRAVENOUS | Status: DC
  Filled 2016-07-22: qty 1000

## 2016-07-22 NOTE — Progress Notes (Signed)
[facility]  ROUNDING NOTE   Subjective:  Patient appears better today. Sodium up to 127 at the moment.  Counseled pt to avoid any further ETOH consumption. He verbalized understanding. States he will go live with his son in [place] for the time being.    Objective:  Vital signs in last 24 hours:  Temp:  [97.6 F (36.4 C)-98.2 F (36.8 C)] 98 F (36.7 C) (10/18 0819) Pulse Rate:  [77-90] 87 (10/18 0819) Resp:  [16-20] 16 (10/18 0425) BP: (133-169)/(82-97) 145/90 (10/18 0819) SpO2:  [99 %-100 %] 99 % (10/18 0819)  Weight change:  Filed Weights   07/19/16 2226  Weight: 54.4 kg (120 lb)    Intake/Output: I/O last 3 completed shifts: In: 1020 [P.O.:720; I.V.:300] Out: 500 [Urine:500]   Intake/Output this shift:  No intake/output data recorded.  Physical Exam: General: No acute distress  Head: Normocephalic, atraumatic. Moist oral mucosal membranes  Eyes: Anicteric  Neck: Supple, trachea midline  Lungs:  Clear to auscultation, normal effort  Heart: S1S2 no rubs  Abdomen:  Soft, nontender, bowel sounds present  Extremities: no peripheral edema.  Neurologic: Nonfocal, moving all four extremities, steady gait  Skin: No lesions       Basic Metabolic Panel:  Recent Labs Lab 07/19/16 2246 07/20/16 0627  07/21/16 0153 07/21/16 0703 07/21/16 1258 07/21/16 1909 07/22/16 0206  NA 115* 125*  < > 126* 124* 122* 125* 127*  K 3.6 3.6  --   --  3.9  --   --   --   CL 82* 95*  --   --  94*  --   --   --   CO2 22 20*  --   --  23  --   --   --   GLUCOSE 90 183*  --   --  84  --   --   --   BUN <5* <5*  --   --  8  --   --   --   CREATININE 0.58* 0.49*  --   --  0.56*  --   --   --   CALCIUM 8.5* 7.9*  --   --  8.6*  --   --   --   MG  --  1.8  --   --   --   --   --   --   PHOS  --  2.8  --   --   --   --   --   --   < > = values in this interval not displayed.  Liver Function Tests: No results for input(s): AST, ALT, ALKPHOS, BILITOT, PROT,  ALBUMIN in the last 168 hours. No results for input(s): LIPASE, AMYLASE in the last 168 hours. No results for input(s): AMMONIA in the last 168 hours.  CBC:  Recent Labs Lab 07/19/16 2246 07/20/16 0627 07/21/16 0703  WBC 7.7 3.8 5.9  HGB 15.8 14.9 15.5  HCT 45.2 40.7 44.1  MCV 101.2* 100.1* 102.4*  PLT 246 192 203    Cardiac Enzymes:  Recent Labs Lab 07/19/16 2246  TROPONINI <0.03    BNP: Invalid input(s): POCBNP  CBG: No results for input(s): GLUCAP in the last 168 hours.  Microbiology: No results found for this or any previous visit.  Coagulation Studies: No results for input(s): LABPROT, INR in the last 72 hours.  Urinalysis:  Recent Labs  07/19/16 2247  COLORURINE STRAW*  LABSPEC 1.002*  PHURINE 6.0  GLUCOSEU NEGATIVE  HGBUR NEGATIVE  BILIRUBINUR NEGATIVE  KETONESUR TRACE*  PROTEINUR NEGATIVE  NITRITE NEGATIVE  LEUKOCYTESUR NEGATIVE      Imaging: No results found.   Medications:     . enoxaparin (LOVENOX) injection  40 mg Subcutaneous Q24H  . folic acid  1 mg Oral Daily  . multivitamin with minerals  1 tablet Oral Daily  . banana bag IV 1000 mL   Intravenous Once  . sodium chloride flush  3 mL Intravenous Q12H  . thiamine  100 mg Oral Daily   Or  . thiamine  100 mg Intravenous Daily   acetaminophen **OR** acetaminophen, bisacodyl, HYDROcodone-acetaminophen, LORazepam **OR** LORazepam, magnesium citrate, ondansetron **OR** ondansetron (ZOFRAN) IV, senna-docusate, zolpidem  Assessment/ Plan:  55 y.o. male with a PMHx of alcohol abuse, bipolar disorder, hypertension, history of seizure disorder who was admitted to [facility] on 07/19/2016 for evaluation of near syncopal event.   1.  Hyponatremia likley secondary to ETOH abuse and tea and toast type diet.  2.  Altered mental status. 3.  ETOH abuse.   Plan:  The patient has long-standing hyponatremia. His baseline serum sodium appears to range between 127-131. I have counseled the patient  extensively regarding avoiding alcohol and following a balanced diet. He verbalizes understanding of this. His mental status appears to have improved and he appears calm and collected today. It appears that he is being discharged. He states that he will [place] living [place]. I advised the patient to seek out a primary care physician and to have his serum sodium followed up. He verbalizes understanding.   LOS: 2 [place] 10/18/20179:59 AM

## 2016-07-22 NOTE — Discharge Summary (Signed)
[facility] - [place] [facility]   PATIENT NAME: [place]    MR#:  [id number]  DATE OF BIRTH:  1961-06-12  DATE OF ADMISSION:  07/19/2016 ADMITTING PHYSICIAN: Tonye RoyaltyAlexis Hugelmeyer, DO  DATE OF DISCHARGE: 07/22/16  PRIMARY CARE PHYSICIAN: No PCP Per Patient    ADMISSION DIAGNOSIS:  Hyponatremia [E87.1] Weakness [R53.1] Alcoholic intoxication without complication (HCC) [F10.920]  DISCHARGE DIAGNOSIS:  Principal Problem:   Bipolar 2 disorder (HCC) Active Problems:   Hyponatremia   Alcohol abuse   Alcohol intoxication (HCC)   SECONDARY DIAGNOSIS:   Past Medical History:  Diagnosis Date  . Alcohol abuse   . Bipolar 1 disorder (HCC)   . H/O staphylococcal infection Childhood   right leg. Chigger bite and scratched "with dirty nails"  . Hypertension   . Seizures (HCC) 2004   due to dehydration, low sodium     HOSPITAL COURSE:  [place]  is a 55 y.o. male admitted 07/19/2016 with chief complaint Near Syncope . Please see H&P performed by Tonye RoyaltyAlexis Hugelmeyer, DO for further information. Patient presented with the above symptoms found to have positive alcohol as well as hyponatremia. Received gentle hydration, and solute adjustment with improvement of sodium to baseline. Nephrology and psychiatry were involved throughout his hospital course.  DISCHARGE CONDITIONS:   stable  CONSULTS OBTAINED:  Treatment Team:  Mady HaagensenMunsoor Lateef, MD Audery AmelJohn T Clapacs, MD  DRUG ALLERGIES:  No Known Allergies  DISCHARGE MEDICATIONS:   Current Discharge Medication List    STOP taking these medications     clonazePAM (KLONOPIN) 0.5 MG tablet      chlordiazePOXIDE (LIBRIUM) 25 MG capsule          DISCHARGE INSTRUCTIONS:    DIET:  Regular diet  DISCHARGE CONDITION:  Stable  ACTIVITY:  Activity as tolerated  OXYGEN:  Home Oxygen: No.   Oxygen Delivery: room air  DISCHARGE LOCATION:  home   If you experience worsening of your admission  symptoms, develop shortness of breath, life threatening emergency, suicidal or homicidal thoughts you must seek medical attention immediately by calling 911 or calling your MD immediately  if symptoms less severe.  You Must read complete instructions/literature along with all the possible adverse reactions/side effects for all the Medicines you take and that have been prescribed to you. Take any new Medicines after you have completely understood and accpet all the possible adverse reactions/side effects.   Please note  You were cared for by a hospitalist during your hospital stay. If you have any questions about your discharge medications or the care you received while you were in the hospital after you are discharged, you can call the unit and asked to speak with the hospitalist on call if the hospitalist that took care of you is not available. Once you are discharged, your primary care physician will handle any further medical issues. Please note that NO REFILLS for any discharge medications will be authorized once you are discharged, as it is imperative that you return to your primary care physician (or establish a relationship with a primary care physician if you do not have one) for your aftercare needs so that they can reassess your need for medications and monitor your lab values.    On the day of Discharge:   VITAL SIGNS:  Blood pressure (!) 145/90, pulse 87, temperature 98 F (36.7 C), temperature source Oral, resp. rate 16, height 5' 5.5" (1.664 m), weight 54.4 kg (120 lb), SpO2 99 %.  I/O:  Intake/Output Summary (Last 24 hours) at 07/22/16 0836 Last data filed at 07/21/16 1800  Gross per 24 hour  Intake              780 ml  Output                0 ml  Net              780 ml    PHYSICAL EXAMINATION:  GENERAL:  55 y.o.-year-old patient lying in the bed with no acute distress.  EYES: Pupils equal, round, reactive to light and accommodation. No scleral icterus. Extraocular  muscles intact.  HEENT: Head atraumatic, normocephalic. Oropharynx and nasopharynx clear.  NECK:  Supple, no jugular venous distention. No thyroid enlargement, no tenderness.  LUNGS: Normal breath sounds bilaterally, no wheezing, rales,rhonchi or crepitation. No use of accessory muscles of respiration.  CARDIOVASCULAR: S1, S2 normal. No murmurs, rubs, or gallops.  ABDOMEN: Soft, non-tender, non-distended. Bowel sounds present. No organomegaly or mass.  EXTREMITIES: No pedal edema, cyanosis, or clubbing.  NEUROLOGIC: Cranial nerves II through XII are intact. Muscle strength 5/5 in all extremities. Sensation intact. Gait not checked.  PSYCHIATRIC: The patient is alert and oriented x 3.  SKIN: No obvious rash, lesion, or ulcer.   DATA REVIEW:   CBC  Recent Labs Lab 07/21/16 0703  WBC 5.9  HGB 15.5  HCT 44.1  PLT 203    Chemistries   Recent Labs Lab 07/20/16 0627  07/21/16 0703  07/22/16 0206  NA 125*  < > 124*  < > 127*  K 3.6  --  3.9  --   --   CL 95*  --  94*  --   --   CO2 20*  --  23  --   --   GLUCOSE 183*  --  84  --   --   BUN <5*  --  8  --   --   CREATININE 0.49*  --  0.56*  --   --   CALCIUM 7.9*  --  8.6*  --   --   MG 1.8  --   --   --   --   < > = values in this interval not displayed.  Cardiac Enzymes  Recent Labs Lab 07/19/16 2246  TROPONINI <0.03    Microbiology Results  No results found for this or any previous visit.  RADIOLOGY:  No results found.   Management plans discussed with the patient, family and they are in agreement.  CODE STATUS:     Code Status Orders        Start     Ordered   07/20/16 0448  Full code  Continuous     07/20/16 0447    Code Status History    Date Active Date Inactive Code Status Order ID Comments User Context   06/26/2015  2:23 AM 06/26/2015  8:44 PM Full Code [id number]  Oralia Manis, MD Inpatient      TOTAL TIME TAKING CARE OF THIS PATIENT: 33 minutes.    Hower,  Mardi Mainland.D on 07/22/2016 at 8:36  AM  Between 7am to 6pm - Pager - [phone]  After 6pm go to [email or web address] - Scientist, research (life sciences) [place] [facility]  Office  [phone]  CC: Primary care physician; No PCP Per Patient

## 2016-07-22 NOTE — Progress Notes (Signed)
Pt being discharged home today. PIV removed. Discharge instructions reviewed with pt, all questions answered. No new prescription needed at discharge. He will follow up with PCP as needed. He is leaving with all his belongings, will be transported home via friend.

## 2016-07-22 NOTE — Care Management (Signed)
Application to [facility] and [facility] delivered to patient. He took the applications but would not allow me to make a referral. He said he has arrangements to see a physician. No further RNCM needs.

## 2017-07-31 IMAGING — CT CT HEAD W/O CM
3 series · 16 of 47 positions shown, 19 images · non-contrast
Comparison: 06/18/2012 CT head.

CLINICAL DATA: 55 y/o  M; confusion.

EXAM:
CT HEAD WITHOUT CONTRAST
TECHNIQUE: Contiguous axial images were obtained from the base of the skull
through the vertex without intravenous contrast.

[Series 2: head wo · axial · 0.43mm/px · z∈[-153,-18]mm · 10 of 33 slices shown, 13 images]
[im 3/33  brain]
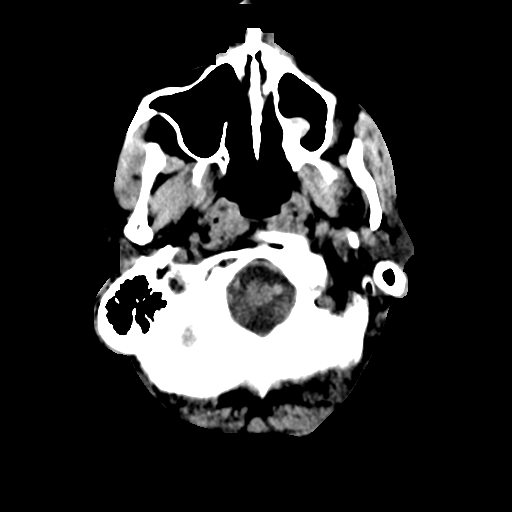
[im 3/33  bone]
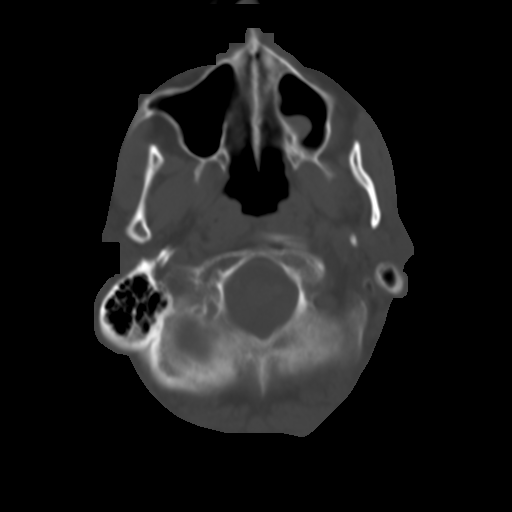
[im 6/33  brain]
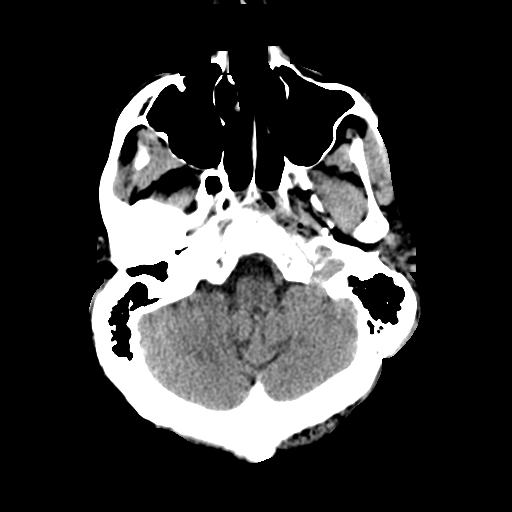
[im 9/33  brain]
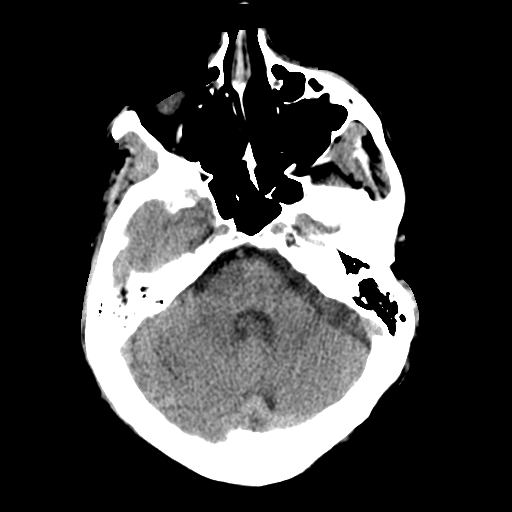
[im 12/33  brain]
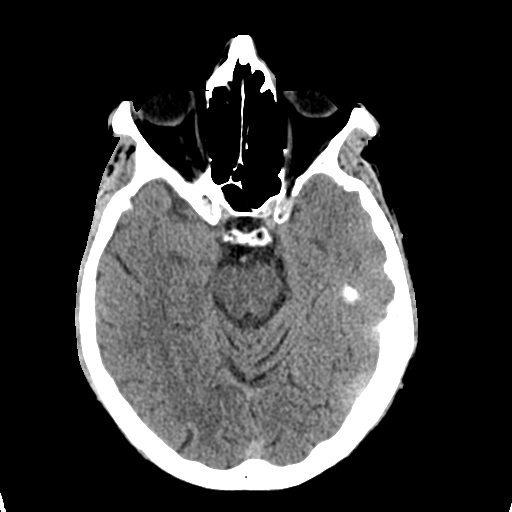
[im 15/33  brain]
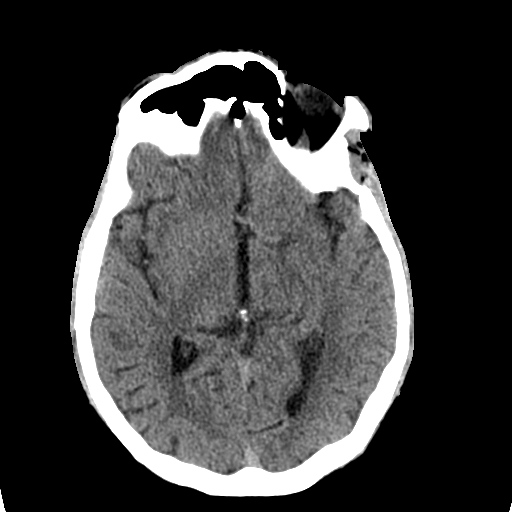
[im 15/33  bone]
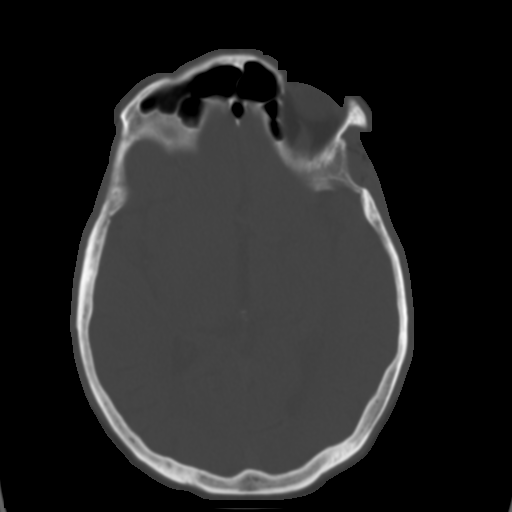
[im 18/33  brain]
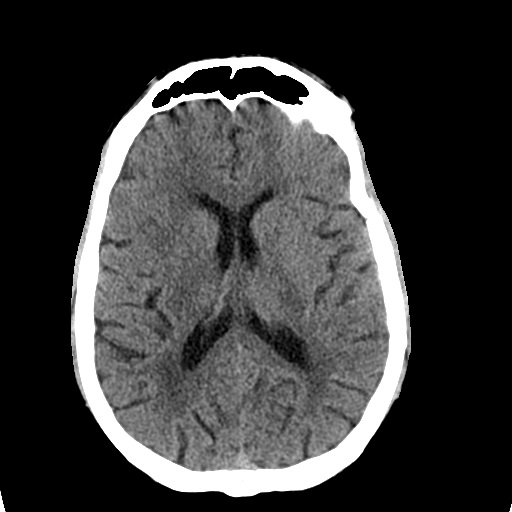
[im 21/33  brain]
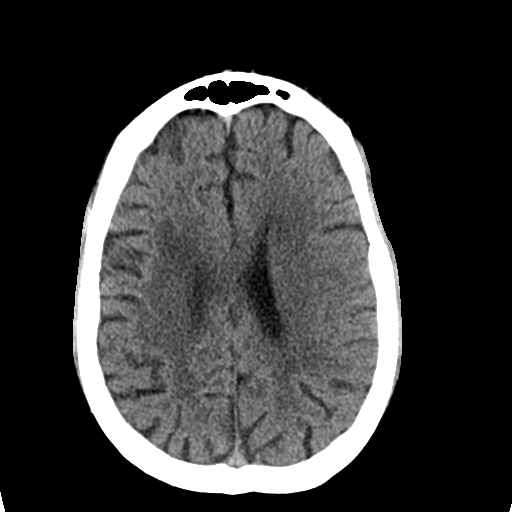
[im 25/33  brain]
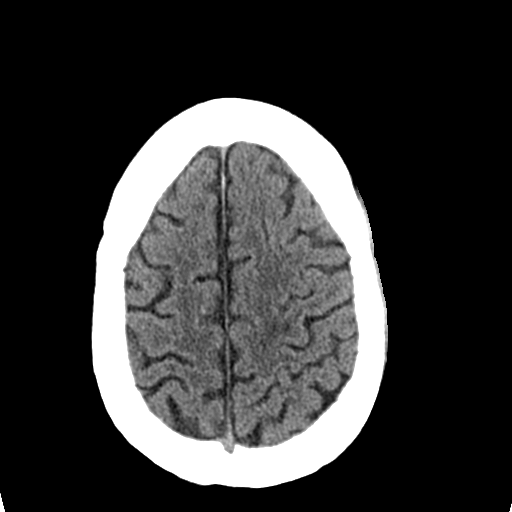
[im 27/33  brain]
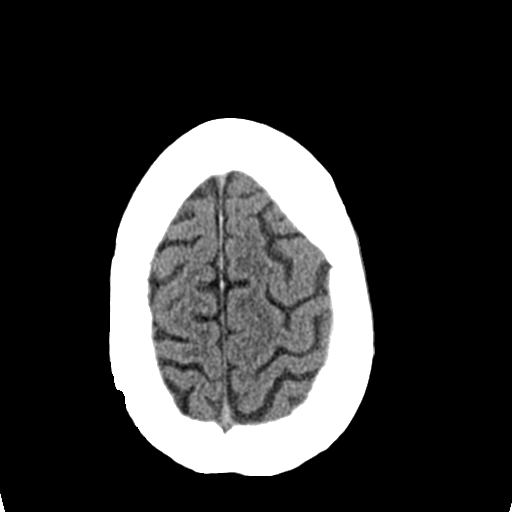
[im 27/33  bone]
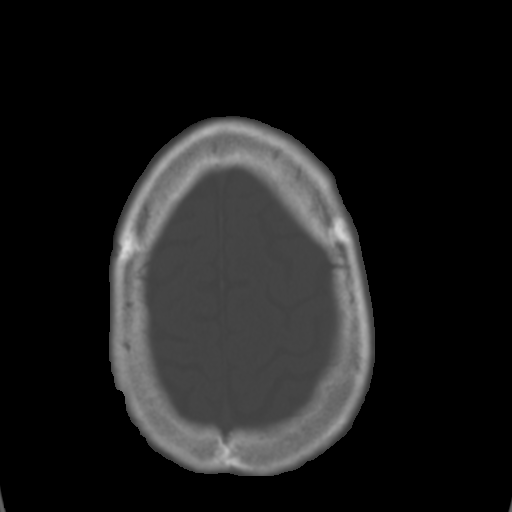
[im 30/33  brain]
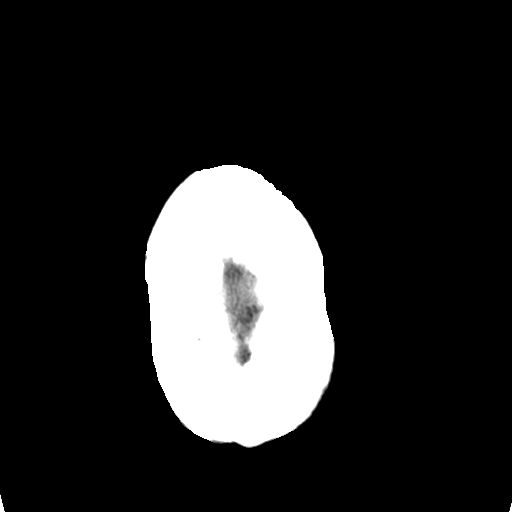

[Series 4: coronal soft tissue · coronal · 0.30mm/px · 3 of 68 slices shown]
[im 23/68  brain]
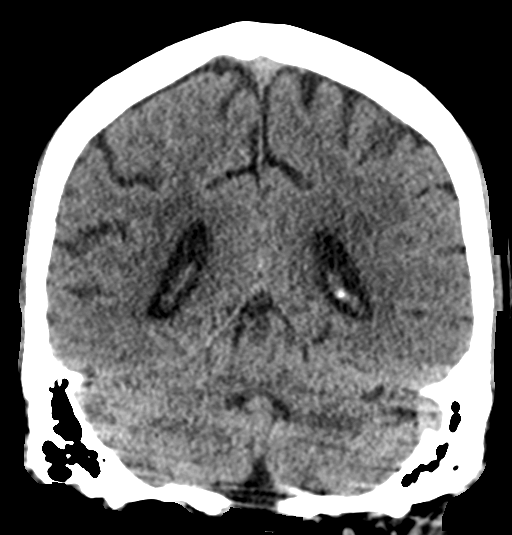
[im 30/68  brain]
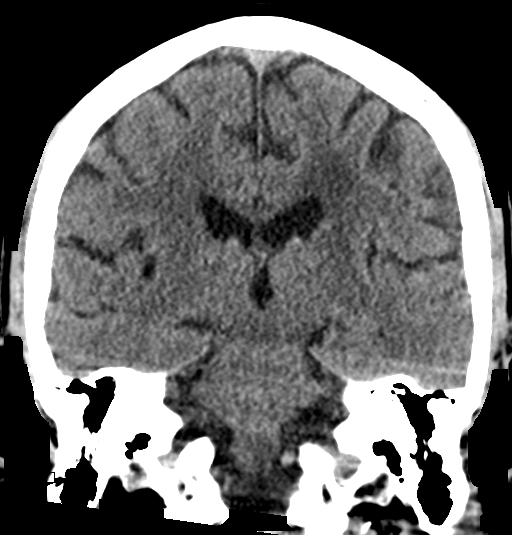
[im 38/68  brain]
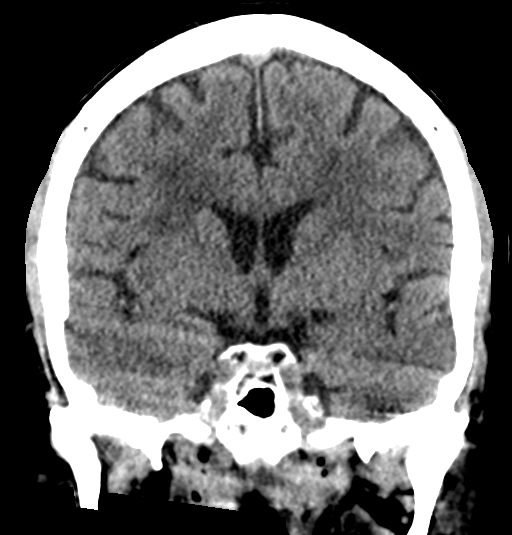

[Series 5: sagittal soft tissue · sagittal · 0.32mm/px · 3 of 52 slices shown]
[im 19/52  brain]
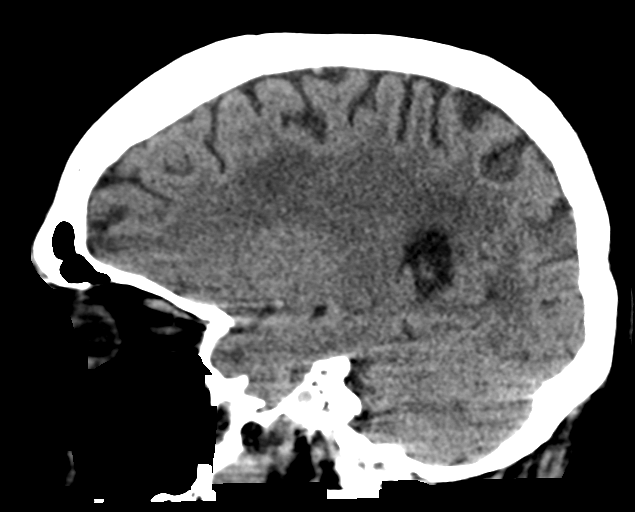
[im 26/52  brain]
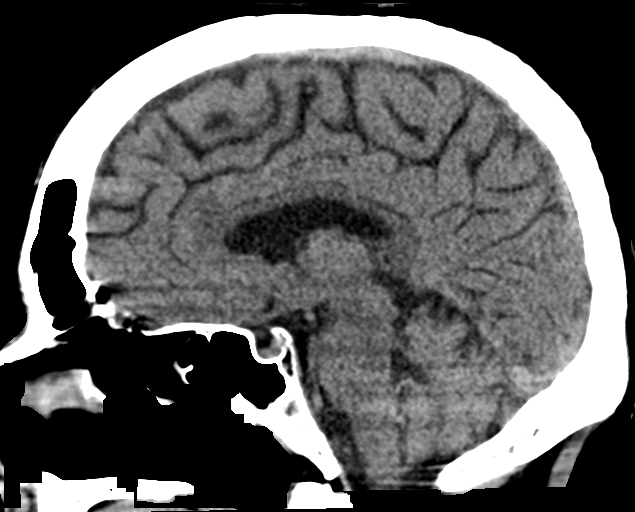
[im 34/52  brain]
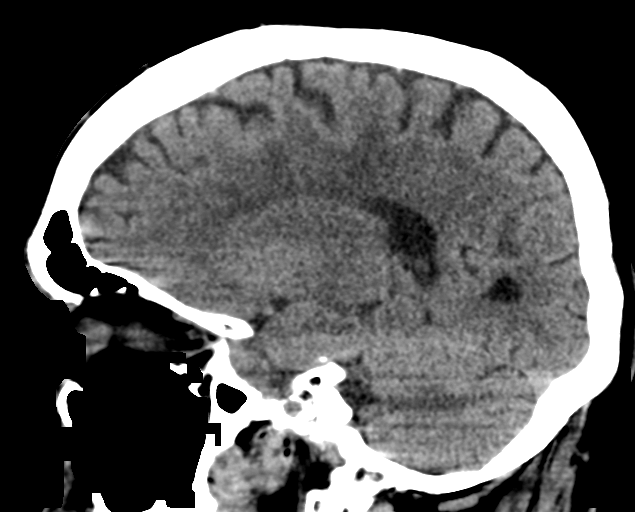

[16 of 47 positions shown; findings below may reference images not displayed]

FINDINGS: Brain: No evidence of acute infarction, hemorrhage, hydrocephalus,
extra-axial collection or mass lesion/mass effect. Stable
nonspecific foci of hypoattenuation in subcortical and
periventricular white matter compatible with mild chronic
microvascular ischemic changes.

Vascular: No hyperdense vessel or unexpected calcification.

Skull: Normal. Negative for fracture or focal lesion.

Sinuses/Orbits: Left maxillary sinus mucous retention cysts,
otherwise normally aerated paranasal sinuses and mastoid air cells.
Orbits are unremarkable.

Other: None.
IMPRESSION: 1. No acute intracranial abnormality is identified.
2. Stable mild chronic microvascular ischemic changes.

By: Ishan Paz M.D.

## 2022-04-04 DEATH — deceased
